# Patient Record
Sex: Female | Born: 1980 | Race: Black or African American | Hispanic: No | Marital: Single | Smoking: Never smoker
Health system: Southern US, Community
[De-identification: ages and names within clinical notes are randomized; demographics above are authoritative.]

## PROBLEM LIST (undated history)

## (undated) DIAGNOSIS — I10 Essential (primary) hypertension: Secondary | ICD-10-CM

## (undated) HISTORY — DX: Essential (primary) hypertension: I10

---

## 2001-12-09 HISTORY — PX: BREAST REDUCTION SURGERY: SHX8

## 2002-04-22 ENCOUNTER — Other Ambulatory Visit: Admission: RE | Admit: 2002-04-22 | Discharge: 2002-04-22 | Payer: Self-pay | Admitting: Obstetrics and Gynecology

## 2002-04-26 ENCOUNTER — Ambulatory Visit (HOSPITAL_BASED_OUTPATIENT_CLINIC_OR_DEPARTMENT_OTHER): Admission: RE | Admit: 2002-04-26 | Discharge: 2002-04-27 | Payer: Self-pay | Admitting: Specialist

## 2004-07-31 ENCOUNTER — Other Ambulatory Visit: Admission: RE | Admit: 2004-07-31 | Discharge: 2004-07-31 | Payer: Self-pay | Admitting: Obstetrics and Gynecology

## 2005-10-28 ENCOUNTER — Other Ambulatory Visit: Admission: RE | Admit: 2005-10-28 | Discharge: 2005-10-28 | Payer: Self-pay | Admitting: Obstetrics & Gynecology

## 2012-02-28 DIAGNOSIS — R519 Headache, unspecified: Secondary | ICD-10-CM | POA: Insufficient documentation

## 2015-07-25 DIAGNOSIS — I10 Essential (primary) hypertension: Secondary | ICD-10-CM | POA: Insufficient documentation

## 2015-09-23 DIAGNOSIS — D500 Iron deficiency anemia secondary to blood loss (chronic): Secondary | ICD-10-CM | POA: Insufficient documentation

## 2018-10-03 ENCOUNTER — Other Ambulatory Visit: Payer: Self-pay

## 2018-10-03 ENCOUNTER — Encounter (HOSPITAL_COMMUNITY): Payer: Self-pay | Admitting: Emergency Medicine

## 2018-10-03 ENCOUNTER — Emergency Department (HOSPITAL_COMMUNITY)
Admission: EM | Admit: 2018-10-03 | Discharge: 2018-10-04 | Disposition: A | Discharge status: Home / Self Care | Payer: BLUE CROSS/BLUE SHIELD | Attending: Emergency Medicine | Admitting: Emergency Medicine

## 2018-10-03 DIAGNOSIS — F19920 Other psychoactive substance use, unspecified with intoxication, uncomplicated: Secondary | ICD-10-CM

## 2018-10-03 DIAGNOSIS — R410 Disorientation, unspecified: Secondary | ICD-10-CM | POA: Insufficient documentation

## 2018-10-03 DIAGNOSIS — R4182 Altered mental status, unspecified: Secondary | ICD-10-CM | POA: Diagnosis present

## 2018-10-03 LAB — COMPREHENSIVE METABOLIC PANEL
ALBUMIN: 4 g/dL (ref 3.5–5.0)
ALT: 21 U/L (ref 0–44)
ANION GAP: 11 (ref 5–15)
AST: 27 U/L (ref 15–41)
Alkaline Phosphatase: 95 U/L (ref 38–126)
BUN: 18 mg/dL (ref 6–20)
CALCIUM: 8.9 mg/dL (ref 8.9–10.3)
CO2: 21 mmol/L — ABNORMAL LOW (ref 22–32)
Chloride: 108 mmol/L (ref 98–111)
Creatinine, Ser: 1.03 mg/dL — ABNORMAL HIGH (ref 0.44–1.00)
GFR calc non Af Amer: >60 mL/min (ref >60)
GLUCOSE: 109 mg/dL — AB (ref 70–99)
POTASSIUM: 3.2 mmol/L — AB (ref 3.5–5.1)
Sodium: 140 mmol/L (ref 135–145)
Total Bilirubin: 0.5 mg/dL (ref 0.3–1.2)
Total Protein: 6.7 g/dL (ref 6.5–8.1)

## 2018-10-03 LAB — I-STAT TROPONIN, ED: TROPONIN I, POC: 0 ng/mL (ref 0.00–0.08)

## 2018-10-03 LAB — I-STAT BETA HCG BLOOD, ED (MC, WL, AP ONLY): I-stat hCG, quantitative: <5 m[IU]/mL (ref <5)

## 2018-10-03 LAB — CBC WITH DIFFERENTIAL/PLATELET
Abs Immature Granulocytes: 0.08 10*3/uL — ABNORMAL HIGH (ref 0.00–0.07)
BASOS ABS: 0.1 10*3/uL (ref 0.0–0.1)
Basophils Relative: 0 %
EOS ABS: 0 10*3/uL (ref 0.0–0.5)
EOS PCT: 0 %
HEMATOCRIT: 36.2 % (ref 36.0–46.0)
Hemoglobin: 11 g/dL — ABNORMAL LOW (ref 12.0–15.0)
Immature Granulocytes: 1 %
LYMPHS ABS: 1.3 10*3/uL (ref 0.7–4.0)
LYMPHS PCT: 7 %
MCH: 24.8 pg — AB (ref 26.0–34.0)
MCHC: 30.4 g/dL (ref 30.0–36.0)
MCV: 81.7 fL (ref 80.0–100.0)
MONOS PCT: 8 %
Monocytes Absolute: 1.3 10*3/uL — ABNORMAL HIGH (ref 0.1–1.0)
NEUTROS ABS: 14.7 10*3/uL — AB (ref 1.7–7.7)
Neutrophils Relative %: 84 %
Platelets: 227 10*3/uL (ref 150–400)
RBC: 4.43 MIL/uL (ref 3.87–5.11)
RDW: 12.9 % (ref 11.5–15.5)
WBC: 17.5 10*3/uL — ABNORMAL HIGH (ref 4.0–10.5)
nRBC: 0 % (ref 0.0–0.2)

## 2018-10-03 LAB — ACETAMINOPHEN LEVEL

## 2018-10-03 LAB — ETHANOL

## 2018-10-03 LAB — SALICYLATE LEVEL: Salicylate Lvl: <7 mg/dL (ref 2.8–30.0)

## 2018-10-03 MED ORDER — LORAZEPAM 2 MG/ML IJ SOLN
2.0000 mg | Freq: Once | INTRAMUSCULAR | Status: AC
  Administered 2018-10-03: 2 mg via INTRAMUSCULAR
  Filled 2018-10-03: qty 1

## 2018-10-03 MED ORDER — SODIUM CHLORIDE 0.9 % IV BOLUS
1000.0000 mL | Freq: Once | INTRAVENOUS | Status: AC
  Administered 2018-10-03: 1000 mL via INTRAVENOUS

## 2018-10-03 MED ORDER — POTASSIUM CHLORIDE 10 MEQ/100ML IV SOLN
10.0000 meq | Freq: Once | INTRAVENOUS | Status: AC
  Administered 2018-10-03: 10 meq via INTRAVENOUS
  Filled 2018-10-03: qty 100

## 2018-10-03 NOTE — ED Notes (Signed)
Attempted to get EKG. Patient keeps throwing her hands around and one of her hands grazed my face. Reported to Nurse.

## 2018-10-03 NOTE — ED Triage Notes (Signed)
Pt presents with friend who states that she has been drinking and ate some chocolate that could have had an illicit drug in it; pt now altered, not acting right; denies pain, sob; ccurently sitting in the floor of A[address]

## 2018-10-03 NOTE — ED Provider Notes (Signed)
[facility] EMERGENCY DEPARTMENT Provider Note   CSN: [id number] Arrival date & time: 10/03/18  1954     History   Chief Complaint Chief Complaint  Patient presents with  . Altered Mental Status  . Alcohol Intoxication    HPI Tara Morris is a 37 y.o. female with no PMHx who presents for evaluation of altered mental status. Patient is alert but only oriented to self. Friend is at bedside and provides the history.   They were at the football game, drinking alcohol and eating chocolate and gummies that may have included marijuana and/or other illicit drugs when the patient began acting strangely. Her activity level waxes and wanes. She goes through episodes of being over excited, wanting to get up and go somewhere and then laying on the ground.   Denies pain, vomiting, chest pain, sob, LOC, syncope. No other medications or medical problems according to the friend.    HPI  History reviewed. No pertinent past medical history.  There are no active problems to display for this patient.   History reviewed. No pertinent surgical history.   OB History   None      Home Medications    Prior to Admission medications   Not on File    Family History History reviewed. No pertinent family history.  Social History Social History   Tobacco Use  . Smoking status: Never Smoker  Substance Use Topics  . Alcohol use: Yes  . Drug use: Not on file    Comment: ukn     Allergies   Patient has no known allergies.   Review of Systems Review of Systems Unable to obtain due to patient's altered mental status  Physical Exam Updated Vital Signs BP 117/81   Pulse (!) 108   Temp 98.7 F (37.1 C) (Oral)   Resp 17   SpO2 97%   Physical Exam Vitals:   10/03/18 2215 10/03/18 2230 10/03/18 2245 10/03/18 2300  BP: 120/83 111/68 113/77 117/81  Pulse: (!) 106 (!) 115 (!) 111 (!) 108  Resp: (!) 23 (!) 25 (!) 26 17  Temp:      TempSrc:      SpO2: 97% 96%  96% 97%   General: Vital signs reviewed.  Patient is well-developed and well-nourished, in no acute distress and cooperative with exam.  Head: Normocephalic and atraumatic. Eyes: PERRL, conjunctivae normal, no scleral icterus.  Cardiovascular: tachycardic, regular rhythm, S1 normal, S2 normal, no murmurs, gallops, or rubs. Pulmonary/Chest: Clear to auscultation bilaterally, no wheezes, rales, or rhonchi. Abdominal: Soft, non-tender, non-distended, BS +, no masses, organomegaly, or guarding present.  Musculoskeletal: No joint deformities, erythema, or stiffness, ROM full and nontender. Neurological: A&O to self only, moving all extremities freely.  Skin: Warm, dry and intact. No rashes or erythema. Psychiatric: anxious mood, alert but minimally responsive to questions, good eye contact   ED Treatments / Results  Labs (all labs ordered are listed, but only abnormal results are displayed) Labs Reviewed  CBC WITH DIFFERENTIAL/PLATELET - Abnormal; Notable for the following components:      Result Value   WBC 17.5 (*)    Hemoglobin 11.0 (*)    MCH 24.8 (*)    Neutro Abs 14.7 (*)    Monocytes Absolute 1.3 (*)    Abs Immature Granulocytes 0.08 (*)    All other components within normal limits  COMPREHENSIVE METABOLIC PANEL - Abnormal; Notable for the following components:   Potassium 3.2 (*)    CO2 21 (*)  Glucose, Bld 109 (*)    Creatinine, Ser 1.03 (*)    All other components within normal limits  ACETAMINOPHEN LEVEL - Abnormal; Notable for the following components:   Acetaminophen (Tylenol), Serum <10 (*)    All other components within normal limits  SALICYLATE LEVEL  ETHANOL  RAPID URINE DRUG SCREEN, HOSP PERFORMED  URINALYSIS, ROUTINE W REFLEX MICROSCOPIC  I-STAT TROPONIN, ED  I-STAT BETA HCG BLOOD, ED ([facility], [facility], [facility] ONLY)    EKG None  Radiology No results found.  Procedures Procedures (including critical care time)  Medications Ordered in ED Medications  sodium  chloride 0.9 % bolus 1,000 mL (has no administration in time range)  potassium chloride 10 mEq in 100 mL IVPB (has no administration in time range)  sodium chloride 0.9 % bolus 1,000 mL (0 mLs Intravenous Stopped 10/03/18 2259)  LORazepam (ATIVAN) injection 2 mg (2 mg Intramuscular Given 10/03/18 2107)     Initial Impression / Assessment and Plan / ED Course  I have reviewed the triage vital signs and the nursing notes.  Pertinent labs & imaging results that were available during my care of the patient were reviewed by me and considered in my medical decision making (see chart for details).     37yo female with no PMHx presents with acute AMS after ingestion alcohol and other unknown substances. Tachycardic but hemodynamically stable. Breathing comfortably. Alert but oriented only to self. Not complaining of pain or chest pain. Uncooperative with exam and lab draws. Will give IM Ativan.   Sedated with Ativan. EKG sinus tach with mildly long QT. Workup negative for alcohol, acetaminophen, salicylate. Low potassium to 3.2, replaced. Leukocytosis with neutrophilia, likely reactive to unknown substance. Urine results pending.   No indication for admission. Will monitor in the ED until she returns to baseline.   Patient signed over to Dr. Clayborne Dana.   Final Clinical Impressions(s) / ED Diagnoses   Final diagnoses:  Disorientation    ED Discharge Orders    None       Ali Lowe, MD 10/03/18 2344    Charlynne Pander, MD 10/06/18 [phone]

## 2018-10-04 LAB — URINALYSIS, ROUTINE W REFLEX MICROSCOPIC
Bilirubin Urine: NEGATIVE
GLUCOSE, UA: NEGATIVE mg/dL
KETONES UR: NEGATIVE mg/dL
LEUKOCYTES UA: NEGATIVE
NITRITE: NEGATIVE
PH: 6 (ref 5.0–8.0)
Protein, ur: 100 mg/dL — AB
RBC / HPF: >50 RBC/hpf — ABNORMAL HIGH (ref 0–5)
Specific Gravity, Urine: 1.018 (ref 1.005–1.030)

## 2018-10-04 LAB — RAPID URINE DRUG SCREEN, HOSP PERFORMED
Amphetamines: NOT DETECTED
BARBITURATES: NOT DETECTED
Benzodiazepines: NOT DETECTED
COCAINE: NOT DETECTED
Opiates: NOT DETECTED
TETRAHYDROCANNABINOL: POSITIVE — AB

## 2018-10-04 NOTE — ED Notes (Addendum)
Dr. Clayborne Dana notified that pts HR in 130-140's after ambulating.

## 2018-10-04 NOTE — ED Provider Notes (Signed)
12:08 AM Assumed care from Dr. Silverio Lay, please see their note for full history, physical and decision making until this point. In brief this is a 37 y.o. year old female who presented to the ED tonight with Altered Mental Status and Alcohol Intoxication     Here with likely intoxication of unknown substance. Improving MS, vitals and waiting urine, clinical sobriety and likely discharge.   After multiple re-evaluations patient does seem to be at her baseline.  She is tolerating p.o.  She is been ambulating around the department without difficulty.  She is awake and alert.  Her family states she is at her baseline.  Patient is stable for discharge at this time.  UDS is positive for marijuana likely edibles with the gummy bears which could produce these effects.  Patient will continue to be monitored by her family at home but low suspicion for any other abnormalities.  Discharge instructions, including strict return precautions for new or worsening symptoms, given. Patient and/or family verbalized understanding and agreement with the plan as described.   Labs, studies and imaging reviewed by myself and considered in medical decision making if ordered. Imaging interpreted by radiology.  Labs Reviewed  CBC WITH DIFFERENTIAL/PLATELET - Abnormal; Notable for the following components:      Result Value   WBC 17.5 (*)    Hemoglobin 11.0 (*)    MCH 24.8 (*)    Neutro Abs 14.7 (*)    Monocytes Absolute 1.3 (*)    Abs Immature Granulocytes 0.08 (*)    All other components within normal limits  COMPREHENSIVE METABOLIC PANEL - Abnormal; Notable for the following components:   Potassium 3.2 (*)    CO2 21 (*)    Glucose, Bld 109 (*)    Creatinine, Ser 1.03 (*)    All other components within normal limits  ACETAMINOPHEN LEVEL - Abnormal; Notable for the following components:   Acetaminophen (Tylenol), Serum <10 (*)    All other components within normal limits  SALICYLATE LEVEL  ETHANOL  RAPID URINE DRUG  SCREEN, HOSP PERFORMED  URINALYSIS, ROUTINE W REFLEX MICROSCOPIC  I-STAT TROPONIN, ED  I-STAT BETA HCG BLOOD, ED ([facility], [facility], [facility] ONLY)    No orders to display    No follow-ups on file.    Exodus Kutzer, Barbara Cower, MD 10/04/18 0430

## 2020-04-02 DIAGNOSIS — Z03818 Encounter for observation for suspected exposure to other biological agents ruled out: Secondary | ICD-10-CM | POA: Diagnosis not present

## 2020-04-02 DIAGNOSIS — Z20828 Contact with and (suspected) exposure to other viral communicable diseases: Secondary | ICD-10-CM | POA: Diagnosis not present

## 2020-04-19 DIAGNOSIS — Z6826 Body mass index (BMI) 26.0-26.9, adult: Secondary | ICD-10-CM | POA: Diagnosis not present

## 2020-04-19 DIAGNOSIS — Z3202 Encounter for pregnancy test, result negative: Secondary | ICD-10-CM | POA: Diagnosis not present

## 2020-04-19 DIAGNOSIS — Z01419 Encounter for gynecological examination (general) (routine) without abnormal findings: Secondary | ICD-10-CM | POA: Diagnosis not present

## 2020-04-20 DIAGNOSIS — Z01419 Encounter for gynecological examination (general) (routine) without abnormal findings: Secondary | ICD-10-CM | POA: Diagnosis not present

## 2020-09-12 ENCOUNTER — Other Ambulatory Visit: Payer: Self-pay

## 2020-09-12 ENCOUNTER — Encounter: Payer: Self-pay | Admitting: Podiatry

## 2020-09-12 ENCOUNTER — Ambulatory Visit (INDEPENDENT_AMBULATORY_CARE_PROVIDER_SITE_OTHER): Payer: BC Managed Care – PPO

## 2020-09-12 ENCOUNTER — Ambulatory Visit (INDEPENDENT_AMBULATORY_CARE_PROVIDER_SITE_OTHER): Payer: BC Managed Care – PPO | Admitting: Podiatry

## 2020-09-12 DIAGNOSIS — S99921A Unspecified injury of right foot, initial encounter: Secondary | ICD-10-CM

## 2020-09-12 DIAGNOSIS — S92501A Displaced unspecified fracture of right lesser toe(s), initial encounter for closed fracture: Secondary | ICD-10-CM

## 2020-09-12 NOTE — Patient Instructions (Signed)
Toe Fracture A toe fracture is a break in one of the toe bones (phalanges). This may happen if you:  Drop a heavy object on your toe.  Stub your toe.  Twist your toe.  Exercise the same way too much. What are the signs or symptoms? The main symptoms are swelling and pain in the toe. You may also have:  Bruising.  Stiffness.  Numbness.  A change in the way the toe looks.  Broken bones that poke through the skin.  Blood under the toenail. How is this treated? Treatments may include:  Taping the broken toe to a toe that is next to it (buddy taping).  Wearing a shoe that has a wide, rigid sole to protect the toe and to limit its movement.  Wearing a cast.  Surgery. This may be needed if the: ? Pieces of broken bone are out of place. ? Bone pokes through the skin.  Physical therapy. Follow these instructions at home: If you have a shoe:  Wear the shoe as told by your doctor. Remove it only as told by your doctor.  Loosen the shoe if your toes tingle, become numb, or turn cold and blue.  Keep the shoe clean and dry. If you have a cast:  Do not put pressure on any part of the cast until it is fully hardened. This may take a few hours.  Do not stick anything inside the cast to scratch your skin.  Check the skin around the cast every day. Tell your doctor about any concerns.  You may put lotion on dry skin around the edges of the cast.  Do not put lotion on the skin under the cast.  Keep the cast clean and dry. Bathing  Do not take baths, swim, or use a hot tub until your doctor says it is okay. Ask your doctor if you can take showers.  If the shoe or cast is not waterproof: ? Do not let it get wet. ? Cover it with a watertight covering when you take a bath or a shower. Activity  Do not use your foot to support your body weight until your doctor says it is okay.  Use crutches as told by your doctor.  Ask your doctor what activities are safe for you  during recovery.  Avoid activities as told by your doctor.  Do exercises as told by your doctor or therapist. Driving  Do not drive or use heavy machinery while taking pain medicine.  Do not drive while wearing a cast on a foot that you use for driving. Managing pain, stiffness, and swelling   Put ice on the injured area if told by your doctor: ? Put ice in a plastic bag. ? Place a towel between your skin and the bag.  If you have a shoe, remove it as told by your doctor.  If you have a cast, place a towel between your cast and the bag. ? Leave the ice on for 20 minutes, 2-3 times per day.  Raise (elevate) the injured area above the level of your heart while you are sitting or lying down. General instructions  If your toe was taped to a toe that is next to it, follow your doctor's instructions for changing the gauze and tape. Change it more often: ? If the gauze and tape get wet. If this happens, dry the space between the toes. ? If the gauze and tape are too tight and they cause your toe to become pale   or to lose feeling (go numb).  If your doctor did not give you a protective shoe, wear sturdy shoes that support your foot. Your shoes should not: ? Pinch your toes. ? Fit tightly against your toes.  Do not use any tobacco products, including cigarettes, chewing tobacco, or e-cigarettes. These can delay bone healing. If you need help quitting, ask your doctor.  Take medicines only as told by your doctor.  Keep all follow-up visits as told by your doctor. This is important. Contact a doctor if:  Your pain medicine is not helping.  You have a fever.  You notice a bad smell coming from your cast. Get help right away if:  You lose feeling (have numbness) in your toe or foot, and it is getting worse.  Your toe or your foot tingles.  Your toe or your foot gets cold or turns blue.  You have redness or swelling in your toe or foot, and it is getting worse.  You have very  bad pain. Summary  A toe fracture is a break in one of the toe bones.  Use ice and raise your foot. This will help lessen pain and swelling.  Use crutches as told by your doctor. This information is not intended to replace advice given to you by your health care provider. Make sure you discuss any questions you have with your health care provider. Document Revised: 01/29/2018 Document Reviewed: 01/06/2018 Elsevier Patient Education  2020 [employer]  

## 2020-09-13 NOTE — Progress Notes (Signed)
Subjective:   Patient ID: Tara Morris, female   DOB: 39 y.o.   MRN: [id number]   HPI 39 year old female presents the office today for concerns of right fifth toe pain.  She states that she tripped and fell injuring her toe about 4 weeks ago and she states it still swells and is tender.  She states that she works on a regular basis any certain movements to aggravate her symptoms.  She said no recent treatment otherwise and she has no other concerns today.   Review of Systems  All other systems reviewed and are negative.  History reviewed. No pertinent past medical history.  History reviewed. No pertinent surgical history.   Current Outpatient Medications:  .  Levonorgestrel-Ethinyl Estradiol (ASHLYNA) 0.15-0.03 &0.01 MG tablet, Take 1 tablet by mouth daily., Disp: , Rfl:  .  nebivolol (BYSTOLIC) 5 MG tablet, Take by mouth., Disp: , Rfl:  .  triamcinolone ointment (KENALOG) 0.1 %, , Disp: , Rfl:  .  LO LOESTRIN FE 1 MG-10 MCG / 10 MCG tablet, Take 1 tablet by mouth daily., Disp: , Rfl:   No Known Allergies       Objective:  Physical Exam  General: AAO x3, NAD  Dermatological: Skin is warm, dry and supple bilateral.  There are no open sores, no preulcerative lesions, no rash or signs of infection present.  Vascular: Dorsalis Pedis artery and Posterior Tibial artery pedal pulses are 2/4 bilateral with immedate capillary fill time.  There is no pain with calf compression, swelling, warmth, erythema.   Neruologic: Grossly intact via light touch bilateral.  Musculoskeletal: There is edema to the right fifth toe with slough in the base of the toe.  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  No other areas of discomfort to the other toes can metatarsals.  Muscular strength 5/5 in all groups tested bilateral.  Gait: Unassisted, Nonantalgic.       Assessment:   Right fifth toe fracture    Plan:  -Treatment options discussed including all alternatives, risks, and  complications -X-rays obtained reviewed.  Transverse fracture of the proximal phalanx base with mild displacement. -At this time I discussed wearing shoes with a stiffer sole.  I would modify some exercises to avoid any pressure to the fifth toe for which we discussed today.  Return in about 6 weeks (around 10/24/2020).  Repeat x-ray next appointment  Vivi Barrack DPM

## 2020-10-17 ENCOUNTER — Ambulatory Visit (INDEPENDENT_AMBULATORY_CARE_PROVIDER_SITE_OTHER): Payer: BC Managed Care – PPO

## 2020-10-17 ENCOUNTER — Ambulatory Visit: Payer: BC Managed Care – PPO | Admitting: Podiatry

## 2020-10-17 ENCOUNTER — Other Ambulatory Visit: Payer: Self-pay

## 2020-10-17 DIAGNOSIS — S92503A Displaced unspecified fracture of unspecified lesser toe(s), initial encounter for closed fracture: Secondary | ICD-10-CM

## 2020-10-17 DIAGNOSIS — M2041 Other hammer toe(s) (acquired), right foot: Secondary | ICD-10-CM

## 2020-10-18 NOTE — Progress Notes (Signed)
Subjective: 39 year old female presents the office today for follow-up evaluation of right fifth toe pain, fracture.  She states that she has modified her activity level.  She states that she does not have significant discomfort at this point and the swelling has resolved  No recent injury since her last saw her.  She states she is using some she may need some pain along her third toes she points on the second third MPJs.  Is been a chronic issue for her. Denies any systemic complaints such as fevers, chills, nausea, vomiting. No acute changes since last appointment, and no other complaints at this time.   Objective: AAO x3, NAD DP/PT pulses palpable bilaterally, CRT less than 3 seconds At this time on the right fifth toe there is no pain to the fifth toe of the toe has no significant edema, erythema.  There is no there is discomfort identified this time but there is contracture of the third digit on the DIPJ and PIPJ.  No significant pain today. No pain with calf compression, swelling, warmth, erythema  Assessment: Right fifth toe fracture with healing; hammertoes  Plan: -All treatment options discussed with the patient including all alternatives, risks, complications.  -X-rays obtained reviewed.  Bone callus formation is present of the fifth toe but fracture line still evident but there is increased consolidation.  No other evidence of acute fracture.  Elongated second third metatarsals. -Regards the fracture site discussed with her wearing stiffer soled shoe but she can gradually start to increase her activity level back to normal activities. -In regards to the other foot pain continue with supportive shoes, inserts.  She discussed surgical intervention.  Discussed these options if needed. -Patient encouraged to call the office with any questions, concerns, change in symptoms.   Vivi Barrack DPM

## 2020-12-04 DIAGNOSIS — N760 Acute vaginitis: Secondary | ICD-10-CM | POA: Diagnosis not present

## 2020-12-04 DIAGNOSIS — Z113 Encounter for screening for infections with a predominantly sexual mode of transmission: Secondary | ICD-10-CM | POA: Diagnosis not present

## 2020-12-04 DIAGNOSIS — N898 Other specified noninflammatory disorders of vagina: Secondary | ICD-10-CM | POA: Diagnosis not present

## 2020-12-22 DIAGNOSIS — B373 Candidiasis of vulva and vagina: Secondary | ICD-10-CM | POA: Diagnosis not present

## 2020-12-22 DIAGNOSIS — N760 Acute vaginitis: Secondary | ICD-10-CM | POA: Diagnosis not present

## 2020-12-22 DIAGNOSIS — A5900 Urogenital trichomoniasis, unspecified: Secondary | ICD-10-CM | POA: Diagnosis not present

## 2020-12-30 ENCOUNTER — Ambulatory Visit (HOSPITAL_COMMUNITY)
Admission: EM | Admit: 2020-12-30 | Discharge: 2020-12-30 | Disposition: A | Discharge status: Home / Self Care | Payer: BC Managed Care – PPO | Attending: Physician Assistant | Admitting: Physician Assistant

## 2020-12-30 ENCOUNTER — Ambulatory Visit (INDEPENDENT_AMBULATORY_CARE_PROVIDER_SITE_OTHER): Payer: BC Managed Care – PPO

## 2020-12-30 ENCOUNTER — Encounter (HOSPITAL_COMMUNITY): Payer: Self-pay | Admitting: Emergency Medicine

## 2020-12-30 ENCOUNTER — Other Ambulatory Visit: Payer: Self-pay

## 2020-12-30 DIAGNOSIS — S63602A Unspecified sprain of left thumb, initial encounter: Secondary | ICD-10-CM

## 2020-12-30 DIAGNOSIS — S6992XA Unspecified injury of left wrist, hand and finger(s), initial encounter: Secondary | ICD-10-CM | POA: Diagnosis not present

## 2020-12-30 DIAGNOSIS — M79645 Pain in left finger(s): Secondary | ICD-10-CM | POA: Diagnosis not present

## 2020-12-30 NOTE — ED Provider Notes (Signed)
[facility]    CSN: [id number] Arrival date & time: 12/30/20  1205      History   Chief Complaint Chief Complaint  Patient presents with  . Finger Injury    Left thumb     HPI Tara Morris is a 40 y.o. female.   The history is provided by the patient. No language interpreter was used.  Hand Pain This is a new problem. The current episode started 3 to 5 hours ago. The problem occurs constantly. The problem has not changed since onset.Nothing aggravates the symptoms. Nothing relieves the symptoms. She has tried nothing for the symptoms. The treatment provided no relief.  Pt hit thumb on a box jumping exercise  History reviewed. No pertinent past medical history.  Patient Active Problem List   Diagnosis Date Noted  . Iron deficiency anemia due to chronic blood loss 09/23/2015  . Essential hypertension 07/25/2015  . Headache 02/28/2012  . Acne 02/28/2012    History reviewed. No pertinent surgical history.  OB History   No obstetric history on file.      Home Medications    Prior to Admission medications   Medication Sig Start Date End Date Taking? Authorizing Provider  LO LOESTRIN FE 1 MG-10 MCG / 10 MCG tablet Take 1 tablet by mouth daily. 07/14/20  Yes [provider]  Levonorgestrel-Ethinyl Estradiol (AMETHIA) 0.15-0.03 &0.01 MG tablet Take 1 tablet by mouth daily. 05/05/15   [provider]  nebivolol (BYSTOLIC) 5 MG tablet Take by mouth. 09/21/15   [provider]  triamcinolone ointment (KENALOG) 0.1 %  01/11/15   [provider]    Family History Family History  Problem Relation Age of Onset  . Hypertension Mother     Social History Social History   Tobacco Use  . Smoking status: Never Smoker  . Smokeless tobacco: Never Used  Substance Use Topics  . Alcohol use: Yes     Allergies   Patient has no known allergies.   Review of Systems Review of Systems  Musculoskeletal: Positive for joint  swelling.  All other systems reviewed and are negative.    Physical Exam Triage Vital Signs ED Triage Vitals  Enc Vitals Group     BP 12/30/20 1234 (!) 154/84     Pulse Rate 12/30/20 1234 67     Resp 12/30/20 1234 18     Temp 12/30/20 1234 98.2 F (36.8 C)     Temp Source 12/30/20 1234 Oral     SpO2 12/30/20 1234 98 %     Weight --      Height --      Head Circumference --      Peak Flow --      Pain Score 12/30/20 1230 5     Pain Loc --      Pain Edu? --      Excl. in GC? --    No data found.  Updated Vital Signs BP (!) 145/99   Pulse 67   Temp 98.2 F (36.8 C) (Oral)   Resp 18   LMP 12/31/2019 (Approximate)   SpO2 98%   Visual Acuity Right Eye Distance:   Left Eye Distance:   Bilateral Distance:    Right Eye Near:   Left Eye Near:    Bilateral Near:     Physical Exam Vitals reviewed.  HENT:     Head: Normocephalic.  Musculoskeletal:        General: Normal range of motion.  Comments: Bruised swollen thumb at [facility]cp.  From  nv and ns intact   Skin:    General: Skin is warm.  Neurological:     General: No focal deficit present.     Mental Status: She is alert.  Psychiatric:        Mood and Affect: Mood normal.      UC Treatments / Results  Labs (all labs ordered are listed, but only abnormal results are displayed) Labs Reviewed - No data to display  EKG   Radiology DG Finger Thumb Left  Result Date: 12/30/2020 CLINICAL DATA:  Trauma this morning.  Pain. EXAM: LEFT THUMB 2+V COMPARISON:  None. FINDINGS: No acute fracture or dislocation. No significant soft tissue swelling. IMPRESSION: No acute osseous abnormality. Electronically Signed   By: Jeronimo Greaves M.D.   On: 12/30/2020 13:29    Procedures Procedures (including critical care time)  Medications Ordered in UC Medications - No data to display  Initial Impression / Assessment and Plan / UC Course  I have reviewed the triage vital signs and the nursing notes.  Pertinent labs &  imaging results that were available during my care of the patient were reviewed by me and considered in my medical decision making (see chart for details).     MDM:  Xray no fracture/  Pt placed in a splint.  Pt advised to recheck in 1 week if any problems.  Final Clinical Impressions(s) / UC Diagnoses   Final diagnoses:  Sprain of left thumb, unspecified site of digit, initial encounter     Discharge Instructions     Return if any problems.    ED Prescriptions    None     PDMP not reviewed this encounter.  An After Visit Summary was printed and given to the patient.    Elson Areas, [place] 12/30/20 1404

## 2020-12-30 NOTE — ED Triage Notes (Signed)
Pt states that she was working out this morning and was doing a specific exercise and when she jumped back down she hit her left thumb on the workout equipment and noticed that her finger went out of place.

## 2020-12-30 NOTE — Discharge Instructions (Signed)
Return if any problems.

## 2021-04-10 DIAGNOSIS — Z1231 Encounter for screening mammogram for malignant neoplasm of breast: Secondary | ICD-10-CM | POA: Diagnosis not present

## 2021-04-11 ENCOUNTER — Other Ambulatory Visit: Payer: Self-pay | Admitting: Obstetrics & Gynecology

## 2021-04-11 DIAGNOSIS — R928 Other abnormal and inconclusive findings on diagnostic imaging of breast: Secondary | ICD-10-CM

## 2021-05-02 DIAGNOSIS — Z01419 Encounter for gynecological examination (general) (routine) without abnormal findings: Secondary | ICD-10-CM | POA: Diagnosis not present

## 2021-05-02 DIAGNOSIS — Z6826 Body mass index (BMI) 26.0-26.9, adult: Secondary | ICD-10-CM | POA: Diagnosis not present

## 2021-05-02 LAB — HM PAP SMEAR: HM Pap smear: NORMAL

## 2021-05-11 ENCOUNTER — Other Ambulatory Visit: Payer: Self-pay

## 2021-05-11 ENCOUNTER — Ambulatory Visit
Admission: RE | Admit: 2021-05-11 | Discharge: 2021-05-11 | Disposition: A | Discharge status: Home / Self Care | Payer: BC Managed Care – PPO | Source: Ambulatory Visit | Attending: Obstetrics & Gynecology | Admitting: Obstetrics & Gynecology

## 2021-05-11 DIAGNOSIS — N6321 Unspecified lump in the left breast, upper outer quadrant: Secondary | ICD-10-CM | POA: Diagnosis not present

## 2021-05-11 DIAGNOSIS — N6002 Solitary cyst of left breast: Secondary | ICD-10-CM | POA: Diagnosis not present

## 2021-05-11 DIAGNOSIS — R928 Other abnormal and inconclusive findings on diagnostic imaging of breast: Secondary | ICD-10-CM

## 2021-07-10 DIAGNOSIS — M25531 Pain in right wrist: Secondary | ICD-10-CM | POA: Diagnosis not present

## 2021-07-10 DIAGNOSIS — S63642A Sprain of metacarpophalangeal joint of left thumb, initial encounter: Secondary | ICD-10-CM | POA: Diagnosis not present

## 2021-07-10 DIAGNOSIS — M79645 Pain in left finger(s): Secondary | ICD-10-CM | POA: Diagnosis not present

## 2021-07-10 DIAGNOSIS — M67431 Ganglion, right wrist: Secondary | ICD-10-CM | POA: Diagnosis not present

## 2021-07-16 DIAGNOSIS — S63115A Dislocation of metacarpophalangeal joint of left thumb, initial encounter: Secondary | ICD-10-CM | POA: Diagnosis not present

## 2021-07-16 DIAGNOSIS — S63642A Sprain of metacarpophalangeal joint of left thumb, initial encounter: Secondary | ICD-10-CM | POA: Diagnosis not present

## 2021-07-16 DIAGNOSIS — M67431 Ganglion, right wrist: Secondary | ICD-10-CM | POA: Diagnosis not present

## 2021-07-18 DIAGNOSIS — L2084 Intrinsic (allergic) eczema: Secondary | ICD-10-CM | POA: Diagnosis not present

## 2021-08-06 DIAGNOSIS — M67431 Ganglion, right wrist: Secondary | ICD-10-CM | POA: Diagnosis not present

## 2021-08-06 DIAGNOSIS — M25642 Stiffness of left hand, not elsewhere classified: Secondary | ICD-10-CM | POA: Diagnosis not present

## 2021-08-06 DIAGNOSIS — S63642A Sprain of metacarpophalangeal joint of left thumb, initial encounter: Secondary | ICD-10-CM | POA: Diagnosis not present

## 2021-08-06 DIAGNOSIS — M25631 Stiffness of right wrist, not elsewhere classified: Secondary | ICD-10-CM | POA: Diagnosis not present

## 2021-08-20 DIAGNOSIS — M25631 Stiffness of right wrist, not elsewhere classified: Secondary | ICD-10-CM | POA: Diagnosis not present

## 2021-08-20 DIAGNOSIS — S63642A Sprain of metacarpophalangeal joint of left thumb, initial encounter: Secondary | ICD-10-CM | POA: Diagnosis not present

## 2021-08-20 DIAGNOSIS — M25642 Stiffness of left hand, not elsewhere classified: Secondary | ICD-10-CM | POA: Diagnosis not present

## 2021-09-03 DIAGNOSIS — M67431 Ganglion, right wrist: Secondary | ICD-10-CM | POA: Diagnosis not present

## 2021-09-03 DIAGNOSIS — S63642A Sprain of metacarpophalangeal joint of left thumb, initial encounter: Secondary | ICD-10-CM | POA: Diagnosis not present

## 2021-09-03 DIAGNOSIS — S63115A Dislocation of metacarpophalangeal joint of left thumb, initial encounter: Secondary | ICD-10-CM | POA: Diagnosis not present

## 2022-01-04 DIAGNOSIS — H02403 Unspecified ptosis of bilateral eyelids: Secondary | ICD-10-CM | POA: Diagnosis not present

## 2022-01-29 ENCOUNTER — Other Ambulatory Visit: Payer: Self-pay

## 2022-01-29 ENCOUNTER — Encounter (HOSPITAL_COMMUNITY): Payer: Self-pay

## 2022-01-29 ENCOUNTER — Ambulatory Visit (HOSPITAL_COMMUNITY)
Admission: EM | Admit: 2022-01-29 | Discharge: 2022-01-29 | Disposition: A | Discharge status: Home / Self Care | Payer: BC Managed Care – PPO | Attending: Student | Admitting: Student

## 2022-01-29 DIAGNOSIS — I10 Essential (primary) hypertension: Secondary | ICD-10-CM | POA: Diagnosis not present

## 2022-01-29 MED ORDER — AMLODIPINE BESYLATE 2.5 MG PO TABS: 2.5000 mg | ORAL_TABLET | Freq: Every day | ORAL | 0 refills | Status: DC

## 2022-01-29 NOTE — ED Provider Notes (Signed)
[facility]    CSN: [id number] Arrival date & time: 01/29/22  8502      History   Chief Complaint No chief complaint on file.   HPI Tara Morris is a 41 y.o. female presenting with hypertension.  Medical history hypertension, this has not been medically treated since 2016.  She is between PCPs, new PCP appointment is 02/11/2022.  Describes few weeks of throbbing frontal headaches rated 2/10.  Denies associated dizziness, vision changes, chest pain, shortness of breath, worst headache of life, weakness.  BP running 120s/80s at home but as high as 150s/100s in the last 3 weeks. Denies changes in routine or lifestyle but did eat doritos last weekend. She does note that she takes OCP contraception with low-dose estrogen.  HPI  History reviewed. No pertinent past medical history.  Patient Active Problem List   Diagnosis Date Noted   Iron deficiency anemia due to chronic blood loss 09/23/2015   Essential hypertension 07/25/2015   Headache 02/28/2012   Acne 02/28/2012    History reviewed. No pertinent surgical history.  OB History   No obstetric history on file.      Home Medications    Prior to Admission medications   Medication Sig Start Date End Date Taking? Authorizing Provider  amLODipine (NORVASC) 2.5 MG tablet Take 1 tablet (2.5 mg total) by mouth daily. 01/29/22  Yes Rhys Martini, PA-C  Levonorgestrel-Ethinyl Estradiol (AMETHIA) 0.15-0.03 &0.01 MG tablet Take 1 tablet by mouth daily. 05/05/15   [provider]  LO LOESTRIN FE 1 MG-10 MCG / 10 MCG tablet Take 1 tablet by mouth daily. 07/14/20   [provider]  nebivolol (BYSTOLIC) 5 MG tablet Take by mouth. 09/21/15   [provider]  triamcinolone ointment (KENALOG) 0.1 %  01/11/15   [provider]    Family History Family History  Problem Relation Age of Onset   Hypertension Mother     Social History Social History   Tobacco Use   Smoking status: Never    Smokeless tobacco: Never  Vaping Use   Vaping Use: Never used  Substance Use Topics   Alcohol use: Yes   Drug use: Never    Comment: ukn     Allergies   Patient has no known allergies.   Review of Systems Review of Systems  Neurological:  Positive for headaches.  All other systems reviewed and are negative.   Physical Exam Triage Vital Signs ED Triage Vitals  Enc Vitals Group     BP 01/29/22 1123 (!) 159/108     Pulse Rate 01/29/22 1123 76     Resp 01/29/22 1123 16     Temp 01/29/22 1123 98.3 F (36.8 C)     Temp Source 01/29/22 1123 Oral     SpO2 01/29/22 1123 96 %     Weight --      Height --      Head Circumference --      Peak Flow --      Pain Score 01/29/22 1124 4     Pain Loc --      Pain Edu? --      Excl. in GC? --    No data found.  Updated Vital Signs BP (!) 159/108 (BP Location: Left Arm)    Pulse 76    Temp 98.3 F (36.8 C) (Oral)    Resp 16    SpO2 96%   Visual Acuity Right Eye Distance:   Left Eye Distance:  Bilateral Distance:    Right Eye Near:   Left Eye Near:    Bilateral Near:     Physical Exam Vitals reviewed.  Constitutional:      Appearance: Normal appearance. She is not diaphoretic.  HENT:     Head: Normocephalic and atraumatic.     Mouth/Throat:     Mouth: Mucous membranes are moist.  Eyes:     Extraocular Movements: Extraocular movements intact.     Pupils: Pupils are equal, round, and reactive to light.  Cardiovascular:     Rate and Rhythm: Normal rate and regular rhythm.     Pulses:          Radial pulses are 2+ on the right side and 2+ on the left side.     Heart sounds: Normal heart sounds.  Pulmonary:     Effort: Pulmonary effort is normal.     Breath sounds: Normal breath sounds.  Abdominal:     Palpations: Abdomen is soft.     Tenderness: There is no abdominal tenderness. There is no guarding or rebound.  Musculoskeletal:     Right lower leg: No edema.     Left lower leg: No edema.  Skin:    General:  Skin is warm.     Capillary Refill: Capillary refill takes less than 2 seconds.  Neurological:     General: No focal deficit present.     Mental Status: She is alert and oriented to person, place, and time.     Comments: CN 2-12 grossly intact, PERRLA, EOMI  Psychiatric:        Mood and Affect: Mood normal.        Behavior: Behavior normal.        Thought Content: Thought content normal.        Judgment: Judgment normal.     UC Treatments / Results  Labs (all labs ordered are listed, but only abnormal results are displayed) Labs Reviewed - No data to display  EKG   Radiology No results found.  Procedures Procedures (including critical care time)  Medications Ordered in UC Medications - No data to display  Initial Impression / Assessment and Plan / UC Course  I have reviewed the triage vital signs and the nursing notes.  Pertinent labs & imaging results that were available during my care of the patient were reviewed by me and considered in my medical decision making (see chart for details).     This patient is a very pleasant 41 y.o. year old female presenting with essential hypertension.   BP running 150s/100s. Will start on low-dose amlodipine. She does take OCP contraception with low-dose estrogen; could discuss stopping/ changing this at PCP appt. She establishes care with new PCP on 02/11/22  ED return precautions discussed. Patient verbalizes understanding and agreement.    Final Clinical Impressions(s) / UC Diagnoses   Final diagnoses:  Essential hypertension     Discharge Instructions      -Amlodipine one pill daily. Take at same time every day.  -Follow-up with PCP as scheduled 02/11/22 -Please check your blood pressure at home or at the pharmacy. If this continues to be >140/90, follow-up with your primary care provider for further blood pressure management/ medication titration. If you develop chest pain, shortness of breath, vision changes, the worst  headache of your life- head straight to the ED or call 911.    ED Prescriptions     Medication Sig Dispense Auth. Provider   amLODipine (NORVASC) 2.5 MG  tablet Take 1 tablet (2.5 mg total) by mouth daily. 30 tablet Rhys Martini, PA-C      PDMP not reviewed this encounter.   Rhys Martini, PA-C 01/29/22 1221

## 2022-01-29 NOTE — Discharge Instructions (Addendum)
-  Amlodipine one pill daily. Take at same time every day.  -Follow-up with PCP as scheduled 02/11/22 -Please check your blood pressure at home or at the pharmacy. If this continues to be >140/90, follow-up with your primary care provider for further blood pressure management/ medication titration. If you develop chest pain, shortness of breath, vision changes, the worst headache of your life- head straight to the ED or call 911.

## 2022-01-29 NOTE — ED Triage Notes (Signed)
Pt presents with headache x 2-3 days and high blood pressure. Mother with history of hypertension.

## 2022-02-08 ENCOUNTER — Other Ambulatory Visit: Payer: Self-pay

## 2022-02-11 ENCOUNTER — Encounter: Payer: Self-pay | Admitting: Nurse Practitioner

## 2022-02-11 ENCOUNTER — Other Ambulatory Visit: Payer: Self-pay

## 2022-02-11 ENCOUNTER — Ambulatory Visit: Payer: BC Managed Care – PPO | Admitting: Nurse Practitioner

## 2022-02-11 VITALS — BP 138/100 | HR 81 | Temp 98.6°F | Ht 65.0 in | Wt 167.4 lb

## 2022-02-11 DIAGNOSIS — E663 Overweight: Secondary | ICD-10-CM

## 2022-02-11 DIAGNOSIS — Z136 Encounter for screening for cardiovascular disorders: Secondary | ICD-10-CM | POA: Diagnosis not present

## 2022-02-11 DIAGNOSIS — Z13220 Encounter for screening for lipoid disorders: Secondary | ICD-10-CM

## 2022-02-11 DIAGNOSIS — I10 Essential (primary) hypertension: Secondary | ICD-10-CM | POA: Diagnosis not present

## 2022-02-11 LAB — CBC WITH DIFFERENTIAL/PLATELET
Basophils Absolute: 0 10*3/uL (ref 0.0–0.1)
Basophils Relative: 0.7 % (ref 0.0–3.0)
Eosinophils Absolute: 0.1 10*3/uL (ref 0.0–0.7)
Eosinophils Relative: 1.8 % (ref 0.0–5.0)
HCT: 37.5 % (ref 36.0–46.0)
Hemoglobin: 12.1 g/dL (ref 12.0–15.0)
Lymphocytes Relative: 27.2 % (ref 12.0–46.0)
Lymphs Abs: 1.9 10*3/uL (ref 0.7–4.0)
MCHC: 32.2 g/dL (ref 30.0–36.0)
MCV: 76.9 fl — ABNORMAL LOW (ref 78.0–100.0)
Monocytes Absolute: 0.3 10*3/uL (ref 0.1–1.0)
Monocytes Relative: 4.9 % (ref 3.0–12.0)
Neutro Abs: 4.5 10*3/uL (ref 1.4–7.7)
Neutrophils Relative %: 65.4 % (ref 43.0–77.0)
Platelets: 238 10*3/uL (ref 150.0–400.0)
RBC: 4.88 Mil/uL (ref 3.87–5.11)
RDW: 12.4 % (ref 11.5–15.5)
WBC: 6.8 10*3/uL (ref 4.0–10.5)

## 2022-02-11 LAB — COMPREHENSIVE METABOLIC PANEL
ALT: 12 U/L (ref 0–35)
AST: 15 U/L (ref 0–37)
Albumin: 4.3 g/dL (ref 3.5–5.2)
Alkaline Phosphatase: 62 U/L (ref 39–117)
BUN: 13 mg/dL (ref 6–23)
CO2: 27 mEq/L (ref 19–32)
Calcium: 9.2 mg/dL (ref 8.4–10.5)
Chloride: 103 mEq/L (ref 96–112)
Creatinine, Ser: 0.89 mg/dL (ref 0.40–1.20)
GFR: 80.51 mL/min (ref >60.00)
Glucose, Bld: 81 mg/dL (ref 70–99)
Potassium: 4.1 mEq/L (ref 3.5–5.1)
Sodium: 137 mEq/L (ref 135–145)
Total Bilirubin: 0.6 mg/dL (ref 0.2–1.2)
Total Protein: 7 g/dL (ref 6.0–8.3)

## 2022-02-11 LAB — HM HEPATITIS C SCREENING LAB: HM Hepatitis Screen: NEGATIVE

## 2022-02-11 LAB — HEMOGLOBIN A1C: Hgb A1c MFr Bld: 4.7 % (ref 4.6–6.5)

## 2022-02-11 LAB — LIPID PANEL
Cholesterol: 137 mg/dL (ref 0–200)
HDL: 54.4 mg/dL (ref >39.00)
LDL Cholesterol: 68 mg/dL (ref 0–99)
NonHDL: 82.36
Total CHOL/HDL Ratio: 3
Triglycerides: 72 mg/dL (ref 0.0–149.0)
VLDL: 14.4 mg/dL (ref 0.0–40.0)

## 2022-02-11 LAB — TSH: TSH: 2.37 u[IU]/mL (ref 0.35–5.50)

## 2022-02-11 LAB — HM HIV SCREENING LAB: HM HIV Screening: NEGATIVE

## 2022-02-11 MED ORDER — AMLODIPINE BESYLATE 5 MG PO TABS: 5.0000 mg | ORAL_TABLET | Freq: Every day | ORAL | 0 refills | Status: DC

## 2022-02-11 NOTE — Patient Instructions (Signed)
It was great to see you! ? ?We are checking your labs today and will send the results to mychart/call you ? ?Increase your amlodipine to 5 mg daily. You can take 2 of your current tablets daily, then go back to 1 tablet daily with your new prescription. Try to limit the amount of salt you are eating to 2,000mg  daily. ? ?Let's follow-up in 4 weeks, sooner if you have concerns. ? ?If a referral was placed today, you will be contacted for an appointment. Please note that routine referrals can sometimes take up to 3-4 weeks to process. Please call our office if you haven't heard anything after this time frame. ? ?Take care, ? ?Rodman Pickle, NP ? ?

## 2022-02-11 NOTE — Progress Notes (Signed)
? ?New Patient Office Visit ? ?Subjective:  ?Patient ID: Tara Morris, female    DOB: Jul 22, 1981  Age: 41 y.o. MRN: [id number] ? ?CC:  ?Chief Complaint  ?Patient presents with  ? Establish Care  ?  Np. Est care. Pt c/o elevated BP. Precrbed low dose of amlodipine 2.5mg    ? ? ?HPI ?Tara Morris presents for new patient visit to establish care.  Introduced to Publishing rights manager role and practice setting.  All questions answered.  Discussed provider/patient relationship and expectations. ? ?She has been experiencing headaches for the past few weeks and went to urgent care because her blood pressure was elevated. She took ibuprofen which did not help the pain. She describes it as not a "full" headache, but feels like one is starting or lingering. She was prescribed amlodipine 2.5mg  which she has been taking daily for the last 2 weeks.  She denies any side effects.  She has not been checking her blood pressure at home since starting this medication. ? ?HYPERTENSION ? ?Hypertension status: uncontrolled  ?Satisfied with current treatment? yes ?Duration of hypertension:  last month ?BP monitoring frequency:  a few times a month ?BP range: 146/96 ?BP medication side effects:  no ?Medication compliance: excellent compliance ?Previous BP meds: amlodipine ?Aspirin: no ?Recurrent headaches: yes ?Visual changes: no ?Palpitations: no ?Dyspnea: no ?Chest pain: no ?Lower extremity edema: no ?Dizzy/lightheaded: no ? ? ?Past Medical History:  ?Diagnosis Date  ? Hypertension   ? ? ?Past Surgical History:  ?Procedure Laterality Date  ? BREAST REDUCTION SURGERY  2003  ? ? ?Family History  ?Problem Relation Age of Onset  ? Hypertension Mother   ? Hyperlipidemia Maternal Grandmother   ? Hypertension Maternal Grandmother   ? Cancer Paternal Grandfather   ?     prostate  ? ? ?Social History  ? ?Socioeconomic History  ? Marital status: Single  ?  Spouse name: Not on file  ? Number of children: Not on file  ? Years of education: Not on  file  ? Highest education level: Not on file  ?Occupational History  ? Not on file  ?Tobacco Use  ? Smoking status: Never  ? Smokeless tobacco: Never  ?Vaping Use  ? Vaping Use: Never used  ?Substance and Sexual Activity  ? Alcohol use: Yes  ?  Comment: socially  ? Drug use: Never  ? Sexual activity: Not on file  ?Other Topics Concern  ? Not on file  ?Social History Narrative  ? Not on file  ? ?Social Determinants of Health  ? ?Financial Resource Strain: Not on file  ?Food Insecurity: Not on file  ?Transportation Needs: Not on file  ?Physical Activity: Not on file  ?Stress: Not on file  ?Social Connections: Not on file  ?Intimate Partner Violence: Not on file  ? ? ?ROS ?Review of Systems  ?Constitutional: Negative.   ?HENT: Negative.    ?Eyes: Negative.   ?Respiratory: Negative.    ?Cardiovascular: Negative.   ?Gastrointestinal: Negative.   ?Endocrine: Negative.   ?Genitourinary: Negative.   ?Musculoskeletal: Negative.   ?Skin: Negative.   ?Neurological:  Positive for headaches. Negative for dizziness.  ?Psychiatric/Behavioral: Negative.    ? ?Objective:  ? ?Today's Vitals: BP (!) 138/100 (BP Location: Right Arm, Cuff Size: Normal)   Pulse 81   Temp 98.6 ?F (37 ?C) (Oral)   Ht 5\' 5"  (1.651 m)   Wt 167 lb 6.4 oz (75.9 kg)   SpO2 100%   BMI 27.86 kg/m?  ? ?Physical  Exam ?Vitals and nursing note reviewed.  ?Constitutional:   ?   General: She is not in acute distress. ?   Appearance: Normal appearance.  ?HENT:  ?   Head: Normocephalic and atraumatic.  ?   Right Ear: Tympanic membrane, ear canal and external ear normal.  ?   Left Ear: Tympanic membrane, ear canal and external ear normal.  ?   Nose: Nose normal.  ?   Mouth/Throat:  ?   Mouth: Mucous membranes are moist.  ?   Pharynx: Oropharynx is clear.  ?Eyes:  ?   Conjunctiva/sclera: Conjunctivae normal.  ?   Pupils: Pupils are equal, round, and reactive to light.  ?Cardiovascular:  ?   Rate and Rhythm: Normal rate and regular rhythm.  ?   Pulses: Normal pulses.   ?   Heart sounds: Normal heart sounds.  ?Pulmonary:  ?   Effort: Pulmonary effort is normal.  ?   Breath sounds: Normal breath sounds.  ?Abdominal:  ?   General: Bowel sounds are normal.  ?   Palpations: Abdomen is soft.  ?   Tenderness: There is no abdominal tenderness.  ?Musculoskeletal:     ?   General: Normal range of motion.  ?   Cervical back: Normal range of motion.  ?   Right lower leg: No edema.  ?   Left lower leg: No edema.  ?Skin: ?   General: Skin is warm and dry.  ?Neurological:  ?   General: No focal deficit present.  ?   Mental Status: She is alert and oriented to person, place, and time.  ?   Motor: No weakness.  ?   Coordination: Coordination normal.  ?   Gait: Gait normal.  ?Psychiatric:     ?   Mood and Affect: Mood normal.     ?   Behavior: Behavior normal.     ?   Thought Content: Thought content normal.     ?   Judgment: Judgment normal.  ? ? ?Assessment & Plan:  ? ?Problem List Items Addressed This Visit   ? ?  ? Cardiovascular and Mediastinum  ? Primary hypertension - Primary  ?  Acute x4 weeks.  Blood pressure today 138/100.  Discussed limiting salt in her diet to 2000 mg daily along with exercise.  We will increase amlodipine to 5 mg daily.  Encouraged her to check her blood pressure daily at home and write it down.  Check CMP, CBC, TSH today.  Of note, she has been on her oral contraceptive pill for numerous years and this is not a new medication.  Follow-up in 4 weeks. ?  ?  ? Relevant Medications  ? amLODipine (NORVASC) 5 MG tablet  ? Other Relevant Orders  ? Comprehensive metabolic panel  ? TSH  ? Hemoglobin A1C  ? CBC with Differential/Platelet  ? ?Other Visit Diagnoses   ? ? Encounter for lipid screening for cardiovascular disease      ? Check lipid panel today, treat based on results.  ? Relevant Orders  ? Lipid panel  ? Overweight      ? Discussed diet and exercise.  Check A1c per patient request.  ? Relevant Orders  ? Hemoglobin A1C  ? ?  ? ? ?Outpatient Encounter Medications as  of 02/11/2022  ?Medication Sig  ? amLODipine (NORVASC) 5 MG tablet Take 1 tablet (5 mg total) by mouth daily.  ? LO LOESTRIN FE 1 MG-10 MCG / 10 MCG tablet Take 1 tablet  by mouth daily.  ? [DISCONTINUED] amLODipine (NORVASC) 2.5 MG tablet Take 1 tablet (2.5 mg total) by mouth daily.  ? Multiple Vitamin (MULTIVITAMIN ADULT PO)   ? [DISCONTINUED] Levonorgestrel-Ethinyl Estradiol (AMETHIA) 0.15-0.03 &0.01 MG tablet Take 1 tablet by mouth daily.  ? [DISCONTINUED] nebivolol (BYSTOLIC) 5 MG tablet Take by mouth. (Patient not taking: Reported on 02/11/2022)  ? [DISCONTINUED] triamcinolone ointment (KENALOG) 0.1 %  (Patient not taking: Reported on 02/11/2022)  ? ?No facility-administered encounter medications on file as of 02/11/2022.  ? ? ?Follow-up: Return in about 4 weeks (around 03/11/2022) for HTN.  ? ?Gerre Scull, NP ? ?

## 2022-02-11 NOTE — Telephone Encounter (Signed)
Noted. We discussed during her visit ?

## 2022-02-11 NOTE — Assessment & Plan Note (Addendum)
Acute x4 weeks.  Blood pressure today 138/100.  Discussed limiting salt in her diet to 2000 mg daily along with exercise.  We will increase amlodipine to 5 mg daily.  Encouraged her to check her blood pressure daily at home and write it down.  Check CMP, CBC, TSH today.  Of note, she has been on her oral contraceptive pill for numerous years and this is not a new medication.  Follow-up in 4 weeks. ?

## 2022-03-07 NOTE — Progress Notes (Signed)
? ?Established Patient Office Visit ? ?Subjective:  ?Patient ID: Tara Morris, female    DOB: 1981-10-29  Age: 41 y.o. MRN: [id number] ? ?CC:  ?Chief Complaint  ?Patient presents with  ? Hypertension  ?  4 wk f/u HTN.   ? ? ?HPI ?Tara Morris presents for follow-up on hypertension.  Her amlodipine was increased to 5 mg daily last visit.  She has not been checking her blood pressure every day at home, however last week it was 124/84.  She has been looking at nutrition labels and trying to limit salt in her diet.  She has not been having headaches as frequently.  She denies chest pain, shortness of breath, and leg swelling. ? ?Past Medical History:  ?Diagnosis Date  ? Hypertension   ? ? ?Past Surgical History:  ?Procedure Laterality Date  ? BREAST REDUCTION SURGERY  2003  ? ? ?Family History  ?Problem Relation Age of Onset  ? Hypertension Mother   ? Hyperlipidemia Maternal Grandmother   ? Hypertension Maternal Grandmother   ? Cancer Paternal Grandfather   ?     prostate  ? ? ?Social History  ? ?Socioeconomic History  ? Marital status: Single  ?  Spouse name: Not on file  ? Number of children: Not on file  ? Years of education: Not on file  ? Highest education level: Not on file  ?Occupational History  ? Not on file  ?Tobacco Use  ? Smoking status: Never  ? Smokeless tobacco: Never  ?Vaping Use  ? Vaping Use: Never used  ?Substance and Sexual Activity  ? Alcohol use: Yes  ?  Comment: socially  ? Drug use: Never  ? Sexual activity: Not on file  ?Other Topics Concern  ? Not on file  ?Social History Narrative  ? Not on file  ? ?Social Determinants of Health  ? ?Financial Resource Strain: Not on file  ?Food Insecurity: Not on file  ?Transportation Needs: Not on file  ?Physical Activity: Not on file  ?Stress: Not on file  ?Social Connections: Not on file  ?Intimate Partner Violence: Not on file  ? ? ?Outpatient Medications Prior to Visit  ?Medication Sig Dispense Refill  ? amLODipine (NORVASC) 5 MG tablet Take 1 tablet  (5 mg total) by mouth daily. 90 tablet 0  ? LO LOESTRIN FE 1 MG-10 MCG / 10 MCG tablet Take 1 tablet by mouth daily.    ? Multiple Vitamin (MULTIVITAMIN ADULT PO)     ? ?No facility-administered medications prior to visit.  ? ? ?No Known Allergies ? ?ROS ?Review of Systems ?See pertinent positives and negatives per HPI. ?  ?Objective:  ?  ?Physical Exam ?Vitals and nursing note reviewed.  ?Constitutional:   ?   General: She is not in acute distress. ?   Appearance: Normal appearance.  ?HENT:  ?   Head: Normocephalic and atraumatic.  ?Eyes:  ?   Conjunctiva/sclera: Conjunctivae normal.  ?Cardiovascular:  ?   Rate and Rhythm: Normal rate and regular rhythm.  ?   Pulses: Normal pulses.  ?   Heart sounds: Normal heart sounds.  ?Pulmonary:  ?   Effort: Pulmonary effort is normal.  ?   Breath sounds: Normal breath sounds.  ?Musculoskeletal:  ?   Cervical back: Normal range of motion.  ?Skin: ?   General: Skin is warm and dry.  ?Neurological:  ?   General: No focal deficit present.  ?   Mental Status: She is alert and oriented to  person, place, and time.  ?Psychiatric:     ?   Mood and Affect: Mood normal.     ?   Behavior: Behavior normal.     ?   Thought Content: Thought content normal.     ?   Judgment: Judgment normal.  ? ? ?BP 129/88 (BP Location: Left Arm, Patient Position: Sitting)   Pulse 84   Temp (!) 96.7 ?F (35.9 ?C) (Temporal)   Wt 170 lb (77.1 kg)   SpO2 99%   BMI 28.29 kg/m?  ?Wt Readings from Last 3 Encounters:  ?03/11/22 170 lb (77.1 kg)  ?02/11/22 167 lb 6.4 oz (75.9 kg)  ? ? ? ?Health Maintenance Due  ?Topic Date Due  ? TETANUS/TDAP  05/04/2020  ? ? ?There are no preventive care reminders to display for this patient. ? ?Lab Results  ?Component Value Date  ? TSH 2.37 02/11/2022  ? ?Lab Results  ?Component Value Date  ? WBC 6.8 02/11/2022  ? HGB 12.1 02/11/2022  ? HCT 37.5 02/11/2022  ? MCV 76.9 (L) 02/11/2022  ? PLT 238.0 02/11/2022  ? ?Lab Results  ?Component Value Date  ? NA 137 02/11/2022  ? K 4.1  02/11/2022  ? CO2 27 02/11/2022  ? GLUCOSE 81 02/11/2022  ? BUN 13 02/11/2022  ? CREATININE 0.89 02/11/2022  ? BILITOT 0.6 02/11/2022  ? ALKPHOS 62 02/11/2022  ? AST 15 02/11/2022  ? ALT 12 02/11/2022  ? PROT 7.0 02/11/2022  ? ALBUMIN 4.3 02/11/2022  ? CALCIUM 9.2 02/11/2022  ? ANIONGAP 11 10/03/2018  ? GFR 80.51 02/11/2022  ? ?Lab Results  ?Component Value Date  ? CHOL 137 02/11/2022  ? ?Lab Results  ?Component Value Date  ? HDL 54.40 02/11/2022  ? ?Lab Results  ?Component Value Date  ? LDLCALC 68 02/11/2022  ? ?Lab Results  ?Component Value Date  ? TRIG 72.0 02/11/2022  ? ?Lab Results  ?Component Value Date  ? CHOLHDL 3 02/11/2022  ? ?Lab Results  ?Component Value Date  ? HGBA1C 4.7 02/11/2022  ? ? ?  ?Assessment & Plan:  ? ?Problem List Items Addressed This Visit   ?None ? ? ?No orders of the defined types were placed in this encounter. ? ? ?Follow-up: No follow-ups on file.  ? ? ?Gerre Scull, NP ?

## 2022-03-11 ENCOUNTER — Ambulatory Visit: Payer: BC Managed Care – PPO | Admitting: Nurse Practitioner

## 2022-03-11 ENCOUNTER — Encounter: Payer: Self-pay | Admitting: Nurse Practitioner

## 2022-03-11 VITALS — BP 129/88 | HR 84 | Temp 96.7°F | Wt 170.0 lb

## 2022-03-11 DIAGNOSIS — I10 Essential (primary) hypertension: Secondary | ICD-10-CM | POA: Diagnosis not present

## 2022-03-11 NOTE — Assessment & Plan Note (Signed)
Chronic, stable.  Blood pressure today 129/88.  We will continue amlodipine 5 mg daily.  Continue limiting salt in her diet.  No refills needed at this time.  Follow-up in 6 months. ?

## 2022-03-11 NOTE — Patient Instructions (Addendum)
It was great to see you! ? ?Keep taking the amlodipine 1 tablet daily.  Keep checking your blood pressure at home as well. ? ?Let's follow-up in 3 months, sooner if you have concerns. ? ?If a referral was placed today, you will be contacted for an appointment. Please note that routine referrals can sometimes take up to 3-4 weeks to process. Please call our office if you haven't heard anything after this time frame. ? ?Take care, ? ?Rodman Pickle, NP ? ?

## 2022-05-23 ENCOUNTER — Other Ambulatory Visit: Payer: Self-pay

## 2022-05-23 MED ORDER — AMLODIPINE BESYLATE 5 MG PO TABS: 5.0000 mg | ORAL_TABLET | Freq: Every day | ORAL | 0 refills | Status: DC

## 2022-06-10 ENCOUNTER — Ambulatory Visit (INDEPENDENT_AMBULATORY_CARE_PROVIDER_SITE_OTHER): Payer: BC Managed Care – PPO | Admitting: Nurse Practitioner

## 2022-06-10 ENCOUNTER — Encounter: Payer: Self-pay | Admitting: Nurse Practitioner

## 2022-06-10 VITALS — BP 126/84 | HR 72 | Temp 97.5°F | Wt 176.2 lb

## 2022-06-10 DIAGNOSIS — Z0000 Encounter for general adult medical examination without abnormal findings: Secondary | ICD-10-CM | POA: Diagnosis not present

## 2022-06-10 DIAGNOSIS — Z23 Encounter for immunization: Secondary | ICD-10-CM

## 2022-06-10 DIAGNOSIS — I10 Essential (primary) hypertension: Secondary | ICD-10-CM

## 2022-06-10 NOTE — Patient Instructions (Signed)
It was great to see you!  Keep taking your amlodipine 5mg  daily.   Let's follow-up in 6 months, sooner if you have concerns.  If a referral was placed today, you will be contacted for an appointment. Please note that routine referrals can sometimes take up to 3-4 weeks to process. Please call our office if you haven't heard anything after this time frame.  Take care,  , NP

## 2022-06-10 NOTE — Progress Notes (Signed)
BP 126/84   Pulse 72   Temp (!) 97.5 F (36.4 C) (Temporal)   Wt 176 lb 3.2 oz (79.9 kg)   SpO2 97%   BMI 29.32 kg/m    Subjective:    Patient ID: Tara Morris, female    DOB: 07-Jul-1981, 41 y.o.   MRN: [id number]  CC: Chief Complaint  Patient presents with   Annual Exam    CPE. Pt is fasting   HPI: Tara Morris is a 41 y.o. female presenting on 06/10/2022 for comprehensive medical examination. Current medical complaints include:none  She currently lives with: alone Menopausal Symptoms: no  Depression Screen done today and results listed below:     06/10/2022    8:30 AM 02/11/2022    8:47 AM  Depression screen PHQ 2/9  Decreased Interest 0 0  Down, Depressed, Hopeless 0 0  PHQ - 2 Score 0 0  Altered sleeping  0  Tired, decreased energy  0  Change in appetite  0  Feeling bad or failure about yourself   0  Trouble concentrating  0  Moving slowly or fidgety/restless  0  Suicidal thoughts  0  PHQ-9 Score  0    The patient does not have a history of falls. I did not complete a risk assessment for falls. A plan of care for falls was not documented.   Past Medical History:  Past Medical History:  Diagnosis Date   Hypertension     Surgical History:  Past Surgical History:  Procedure Laterality Date   BREAST REDUCTION SURGERY  2003    Medications:  Current Outpatient Medications on File Prior to Visit  Medication Sig   amLODipine (NORVASC) 5 MG tablet Take 1 tablet (5 mg total) by mouth daily.   LO LOESTRIN FE 1 MG-10 MCG / 10 MCG tablet Take 1 tablet by mouth daily.   Multiple Vitamin (MULTIVITAMIN ADULT PO)    No current facility-administered medications on file prior to visit.    Allergies:  No Known Allergies  Social History:  Social History   Socioeconomic History   Marital status: Single    Spouse name: Not on file   Number of children: Not on file   Years of education: Not on file   Highest education level: Not on file  Occupational  History   Not on file  Tobacco Use   Smoking status: Never   Smokeless tobacco: Never  Vaping Use   Vaping Use: Never used  Substance and Sexual Activity   Alcohol use: Yes    Comment: socially   Drug use: Never   Sexual activity: Not on file  Other Topics Concern   Not on file  Social History Narrative   Not on file   Social Determinants of Health   Financial Resource Strain: Not on file  Food Insecurity: Not on file  Transportation Needs: Not on file  Physical Activity: Not on file  Stress: Not on file  Social Connections: Not on file  Intimate Partner Violence: Not on file   Social History   Tobacco Use  Smoking Status Never  Smokeless Tobacco Never   Social History   Substance and Sexual Activity  Alcohol Use Yes   Comment: socially    Family History:  Family History  Problem Relation Age of Onset   Hypertension Mother    Hyperlipidemia Maternal Grandmother    Hypertension Maternal Grandmother    Cancer Paternal Grandfather        prostate  Past medical history, surgical history, medications, allergies, family history and social history reviewed with patient today and changes made to appropriate areas of the chart.   Review of Systems  Constitutional: Negative.   HENT: Negative.    Eyes: Negative.   Respiratory: Negative.    Cardiovascular: Negative.   Gastrointestinal: Negative.   Genitourinary: Negative.   Musculoskeletal: Negative.   Skin: Negative.   Neurological: Negative.   Psychiatric/Behavioral: Negative.     All other ROS negative except what is listed above and in the HPI.      Objective:    BP 126/84   Pulse 72   Temp (!) 97.5 F (36.4 C) (Temporal)   Wt 176 lb 3.2 oz (79.9 kg)   SpO2 97%   BMI 29.32 kg/m   Wt Readings from Last 3 Encounters:  06/10/22 176 lb 3.2 oz (79.9 kg)  03/11/22 170 lb (77.1 kg)  02/11/22 167 lb 6.4 oz (75.9 kg)    Physical Exam Vitals and nursing note reviewed.  Constitutional:       General: She is not in acute distress.    Appearance: Normal appearance.  HENT:     Head: Normocephalic and atraumatic.     Right Ear: Tympanic membrane, ear canal and external ear normal.     Left Ear: Tympanic membrane, ear canal and external ear normal.     Nose: Nose normal.     Mouth/Throat:     Mouth: Mucous membranes are moist.     Pharynx: Oropharynx is clear.  Eyes:     Conjunctiva/sclera: Conjunctivae normal.  Cardiovascular:     Rate and Rhythm: Normal rate and regular rhythm.     Pulses: Normal pulses.     Heart sounds: Normal heart sounds.  Pulmonary:     Effort: Pulmonary effort is normal.     Breath sounds: Normal breath sounds.  Abdominal:     General: Bowel sounds are normal.     Palpations: Abdomen is soft.     Tenderness: There is no abdominal tenderness.  Musculoskeletal:        General: Normal range of motion.     Cervical back: Normal range of motion.  Skin:    General: Skin is warm and dry.  Neurological:     General: No focal deficit present.     Mental Status: She is alert and oriented to person, place, and time.     Cranial Nerves: No cranial nerve deficit.     Coordination: Coordination normal.     Gait: Gait normal.  Psychiatric:        Mood and Affect: Mood normal.        Behavior: Behavior normal.        Thought Content: Thought content normal.        Judgment: Judgment normal.     Results for orders placed or performed in visit on 02/14/22  HM PAP SMEAR  Result Value Ref Range   HM Pap smear normal       Assessment & Plan:   Problem List Items Addressed This Visit       Cardiovascular and Mediastinum   Primary hypertension    Chronic, stable. BP today 126/84. Continue amlodipine 5mg  daily. Follow-up in 6 months.       Other Visit Diagnoses     Routine general medical examination at a health care facility    -  Primary   Health maintenance reviewed and updated. Utd pap, mammogram. Will updated Td today. Follow-up  1 year    Need for Td vaccine       Td booster updated today   Relevant Orders   Td vaccine greater than or equal to 7yo preservative free IM (Completed)        Follow up plan: Return in about 6 months (around 12/11/2022) for HTN.   LABORATORY TESTING:  - Pap smear: up to date  IMMUNIZATIONS:   - Tdap: Tetanus vaccination status reviewed: Td vaccination indicated and given today. - Influenza: Postponed to flu season - Pneumovax: Not applicable - Prevnar: Not applicable - HPV: Not applicable - Zostavax vaccine: Not applicable  SCREENING: -Mammogram: Done elsewhere  - Colonoscopy: Not applicable  - Bone Density: Not applicable  -Hearing Test: Not applicable  -Spirometry: Not applicable   PATIENT COUNSELING:   Advised to take 1 mg of folate supplement per day if capable of pregnancy.   Sexuality: Discussed sexually transmitted diseases, partner selection, use of condoms, avoidance of unintended pregnancy  and contraceptive alternatives.   Advised to avoid cigarette smoking.  I discussed with the patient that most people either abstain from alcohol or drink within safe limits (<=14/week and <=4 drinks/occasion for males, <=7/weeks and <= 3 drinks/occasion for females) and that the risk for alcohol disorders and other health effects rises proportionally with the number of drinks per week and how often a drinker exceeds daily limits.  Discussed cessation/primary prevention of drug use and availability of treatment for abuse.   Diet: Encouraged to adjust caloric intake to maintain  or achieve ideal body weight, to reduce intake of dietary saturated fat and total fat, to limit sodium intake by avoiding high sodium foods and not adding table salt, and to maintain adequate dietary potassium and calcium preferably from fresh fruits, vegetables, and low-fat dairy products.    stressed the importance of regular exercise  Injury prevention: Discussed safety belts, safety helmets, smoke detector,  smoking near bedding or upholstery.   Dental health: Discussed importance of regular tooth brushing, flossing, and dental visits.    NEXT PREVENTATIVE PHYSICAL DUE IN 1 YEAR. Return in about 6 months (around 12/11/2022) for HTN.

## 2022-06-10 NOTE — Assessment & Plan Note (Signed)
Chronic, stable. BP today 126/84. Continue amlodipine 5mg  daily. Follow-up in 6 months.

## 2022-07-22 DIAGNOSIS — Z124 Encounter for screening for malignant neoplasm of cervix: Secondary | ICD-10-CM | POA: Diagnosis not present

## 2022-07-22 DIAGNOSIS — Z6829 Body mass index (BMI) 29.0-29.9, adult: Secondary | ICD-10-CM | POA: Diagnosis not present

## 2022-07-22 DIAGNOSIS — Z1231 Encounter for screening mammogram for malignant neoplasm of breast: Secondary | ICD-10-CM | POA: Diagnosis not present

## 2022-07-22 DIAGNOSIS — I10 Essential (primary) hypertension: Secondary | ICD-10-CM | POA: Diagnosis not present

## 2022-07-22 DIAGNOSIS — Z1151 Encounter for screening for human papillomavirus (HPV): Secondary | ICD-10-CM | POA: Diagnosis not present

## 2022-07-22 DIAGNOSIS — Z01419 Encounter for gynecological examination (general) (routine) without abnormal findings: Secondary | ICD-10-CM | POA: Diagnosis not present

## 2022-07-22 LAB — HM PAP SMEAR

## 2022-07-22 LAB — RESULTS CONSOLE HPV: CHL HPV: NEGATIVE

## 2022-10-14 ENCOUNTER — Other Ambulatory Visit: Payer: Self-pay | Admitting: Nurse Practitioner

## 2022-12-16 ENCOUNTER — Ambulatory Visit: Payer: BC Managed Care – PPO | Admitting: Nurse Practitioner

## 2022-12-30 ENCOUNTER — Ambulatory Visit: Payer: No Typology Code available for payment source | Admitting: Nurse Practitioner

## 2022-12-30 ENCOUNTER — Encounter: Payer: Self-pay | Admitting: Nurse Practitioner

## 2022-12-30 VITALS — BP 122/78 | HR 78 | Ht 65.0 in | Wt 174.0 lb

## 2022-12-30 DIAGNOSIS — M722 Plantar fascial fibromatosis: Secondary | ICD-10-CM | POA: Diagnosis not present

## 2022-12-30 DIAGNOSIS — I10 Essential (primary) hypertension: Secondary | ICD-10-CM

## 2022-12-30 NOTE — Progress Notes (Signed)
Established Patient Office Visit  Subjective   Patient ID: Tara Morris, female    DOB: 1980-12-30  Age: 42 y.o. MRN: [id number]  Chief Complaint  Patient presents with   Follow-up    6 month follow up for  blood pressure  she had check at work , she is check b/p every other week at home, she said running 116/72    HPI  Tara Morris presents to follow-up on hypertension.  She states that her blood pressure has been doing well, 110s/70s at home.  She has been adjusting her diet and exercising more frequently.  She is hoping to come off of the blood pressure medication.  She denies chest pain, shortness of breath, headaches.  She states that on Saturday morning she woke up with her right heel hurting.  She states when she got out of bed it hurt for when she first started walking and then went away.  She states that has been sore off and on since then.  It is slightly getting better.  Walking after sitting for long periods makes it worse.  She has not tried anything at home.    ROS See pertinent positives and negatives per HPI.    Objective:     BP 122/78   Pulse 78   Ht 5\' 5"  (1.651 m)   Wt 174 lb (78.9 kg)   SpO2 98%   BMI 28.96 kg/m  BP Readings from Last 3 Encounters:  12/30/22 122/78  06/10/22 126/84  03/11/22 129/88   Wt Readings from Last 3 Encounters:  12/30/22 174 lb (78.9 kg)  06/10/22 176 lb 3.2 oz (79.9 kg)  03/11/22 170 lb (77.1 kg)    Physical Exam Vitals and nursing note reviewed.  Constitutional:      General: She is not in acute distress.    Appearance: Normal appearance.  HENT:     Head: Normocephalic.  Eyes:     Conjunctiva/sclera: Conjunctivae normal.  Cardiovascular:     Rate and Rhythm: Normal rate and regular rhythm.     Pulses: Normal pulses.     Heart sounds: Normal heart sounds.  Pulmonary:     Effort: Pulmonary effort is normal.     Breath sounds: Normal breath sounds.  Musculoskeletal:        General: No swelling or  tenderness. Normal range of motion.     Cervical back: Normal range of motion.  Skin:    General: Skin is warm.  Neurological:     General: No focal deficit present.     Mental Status: She is alert and oriented to person, place, and time.  Psychiatric:        Mood and Affect: Mood normal.        Behavior: Behavior normal.        Thought Content: Thought content normal.        Judgment: Judgment normal.     The 10-year ASCVD risk score (Arnett DK, et al., 2019) is: 0.4%    Assessment & Plan:   Problem List Items Addressed This Visit       Cardiovascular and Mediastinum   Primary hypertension - Primary    Chronic, stable.  BP today 122/78.  Will have her stop her amlodipine since she has been making lifestyle changes and see how her blood pressure is doing off medication.  Encouraged her to continue monitoring it at home daily.  Encouraged her to reach out if her blood pressure goes above 409 systolic  for 5 to 7 days in a row.  Continue with exercise and limiting salt in her diet.  Follow-up in 6 months.        Musculoskeletal and Integument   Plantar fasciitis of right foot    She has been having right heel pain since Saturday that is worse when she gets out of bed in the morning.  Most likely plantar fasciitis.  Will have her start using ice and rolling her foot over frozen water bottle or tennis ball several times a day.  She can also take ibuprofen as needed for pain.  Stretches given to do as well.  Follow-up if symptoms worsen or do not improve.       Return in about 6 months (around 06/30/2023) for CPE.    Charyl Dancer, NP

## 2022-12-30 NOTE — Addendum Note (Signed)
Addended by: Vance Peper A on: 12/30/2022 08:30 AM   Modules accepted: Level of Service

## 2022-12-30 NOTE — Assessment & Plan Note (Signed)
Chronic, stable.  BP today 122/78.  Will have her stop her amlodipine since she has been making lifestyle changes and see how her blood pressure is doing off medication.  Encouraged her to continue monitoring it at home daily.  Encouraged her to reach out if her blood pressure goes above 680 systolic for 5 to 7 days in a row.  Continue with exercise and limiting salt in her diet.  Follow-up in 6 months.

## 2022-12-30 NOTE — Patient Instructions (Signed)
It was great to see you!  You can stop the amlodipine and keep checking your blood pressure. If it goes up above 140 for 5-7 days in a row, let me know. Keep up the great work with your exercise.   For your heel, roll it over ice or a tennis ball to help with the pain. You can also take ibuprofen as needed.   Let's follow-up in 6 months, sooner if you have concerns.  If a referral was placed today, you will be contacted for an appointment. Please note that routine referrals can sometimes take up to 3-4 weeks to process. Please call our office if you haven't heard anything after this time frame.  Take care,  Vance Peper, NP

## 2022-12-30 NOTE — Assessment & Plan Note (Signed)
She has been having right heel pain since Saturday that is worse when she gets out of bed in the morning.  Most likely plantar fasciitis.  Will have her start using ice and rolling her foot over frozen water bottle or tennis ball several times a day.  She can also take ibuprofen as needed for pain.  Stretches given to do as well.  Follow-up if symptoms worsen or do not improve.

## 2023-01-23 ENCOUNTER — Telehealth: Payer: Self-pay | Admitting: Nurse Practitioner

## 2023-01-23 NOTE — Telephone Encounter (Signed)
LVM for patient to RTC

## 2023-01-24 NOTE — Telephone Encounter (Signed)
Noted  

## 2023-01-24 NOTE — Telephone Encounter (Signed)
I spoke with patient and she said that she had spoken with someone this morning about this message. She said that she doesn't need a refill and she still has some pills that she will finish her bottle.

## 2023-01-29 ENCOUNTER — Other Ambulatory Visit: Payer: Self-pay | Admitting: Nurse Practitioner

## 2023-01-29 NOTE — Telephone Encounter (Signed)
LVM for patient to return call. 

## 2023-01-31 NOTE — Telephone Encounter (Signed)
I spoke with patient and she wanted to complete her bottle of medication and the stop taking it for now. Patient will call and make an appointment later if need too.

## 2023-02-01 IMAGING — MG MM DIGITAL DIAGNOSTIC UNILAT*L* W/ TOMO W/ CAD
4 series · 4 of 12 positions shown · non-contrast
Comparison: Previous exam(s).

CLINICAL DATA: 39-year-old female for further evaluation of
possible LEFT breast mass on screening mammogram.

EXAM:
DIGITAL DIAGNOSTIC UNILATERAL LEFT MAMMOGRAM WITH TOMOSYNTHESIS AND
CAD; ULTRASOUND LEFT BREAST LIMITED
TECHNIQUE: Left digital diagnostic mammography and breast tomosynthesis was
performed. The images were evaluated with computer-aided detection.;
Targeted ultrasound examination of the left breast was performed

[L CC synth-2D]
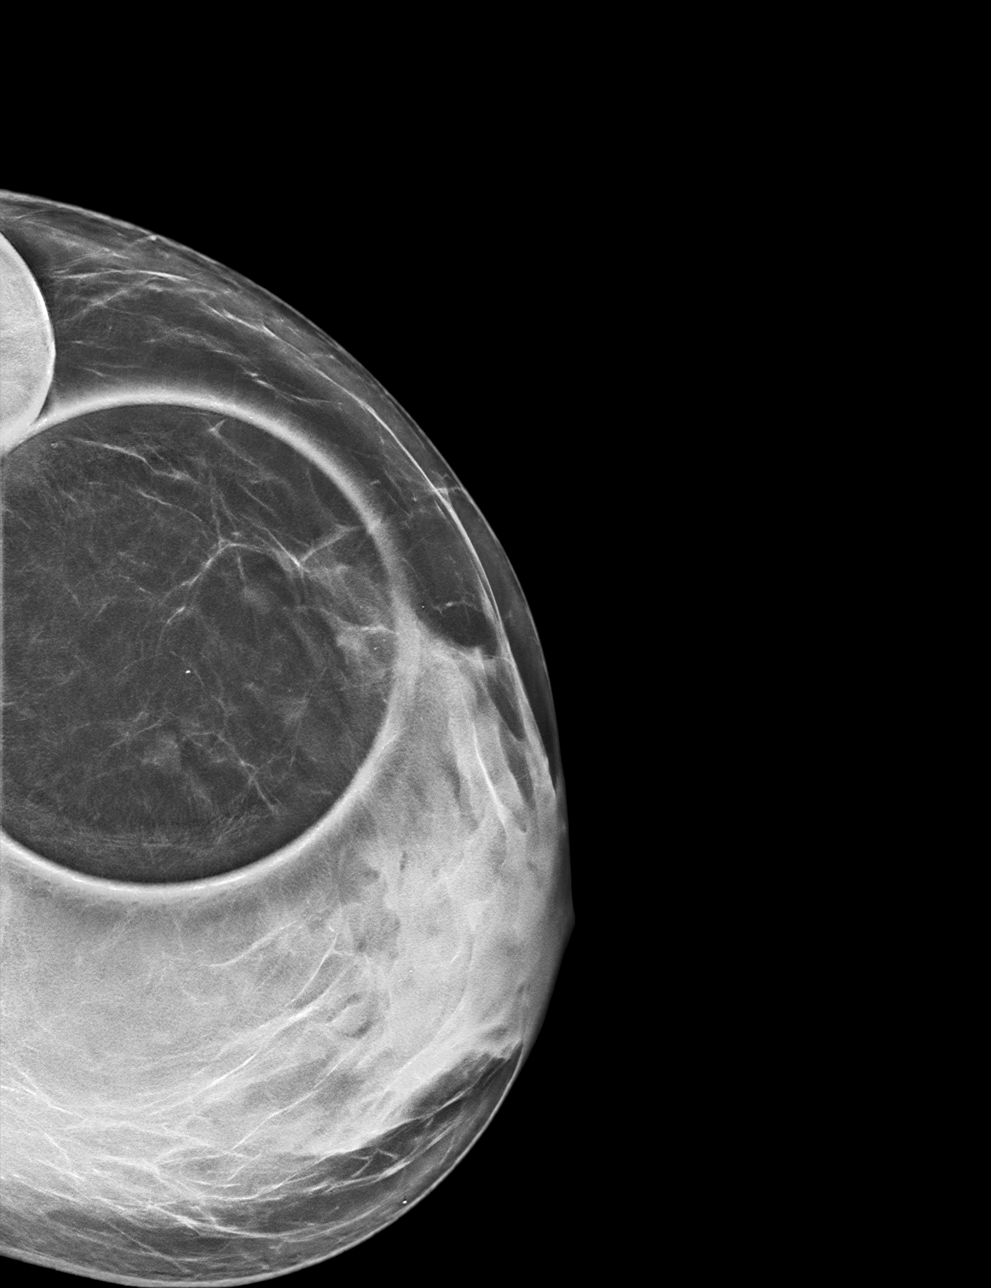

[L MLO synth-2D]
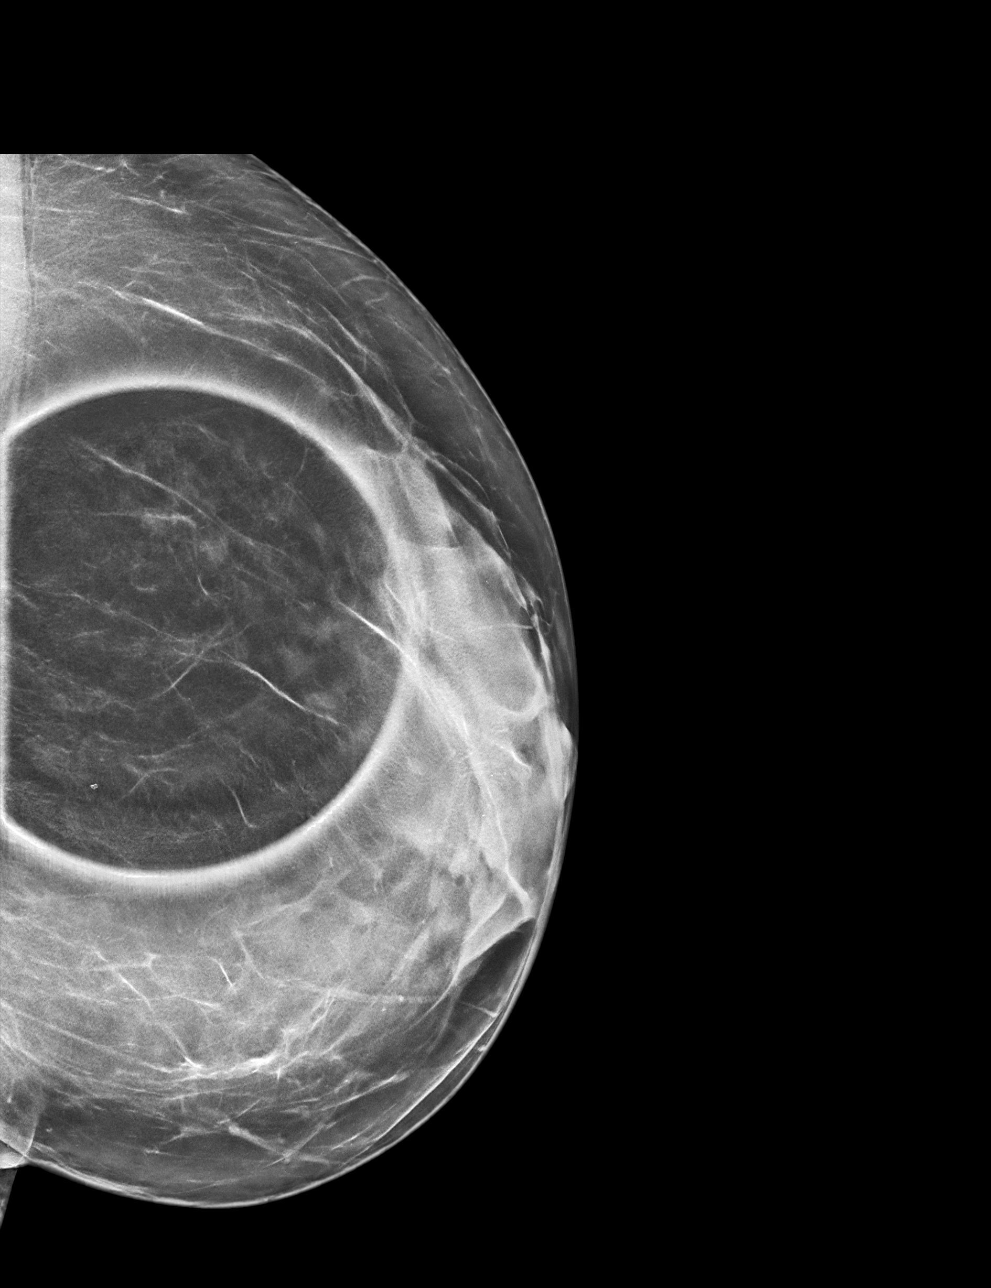

[L MLO tomo · tomo slice 34/67.0]
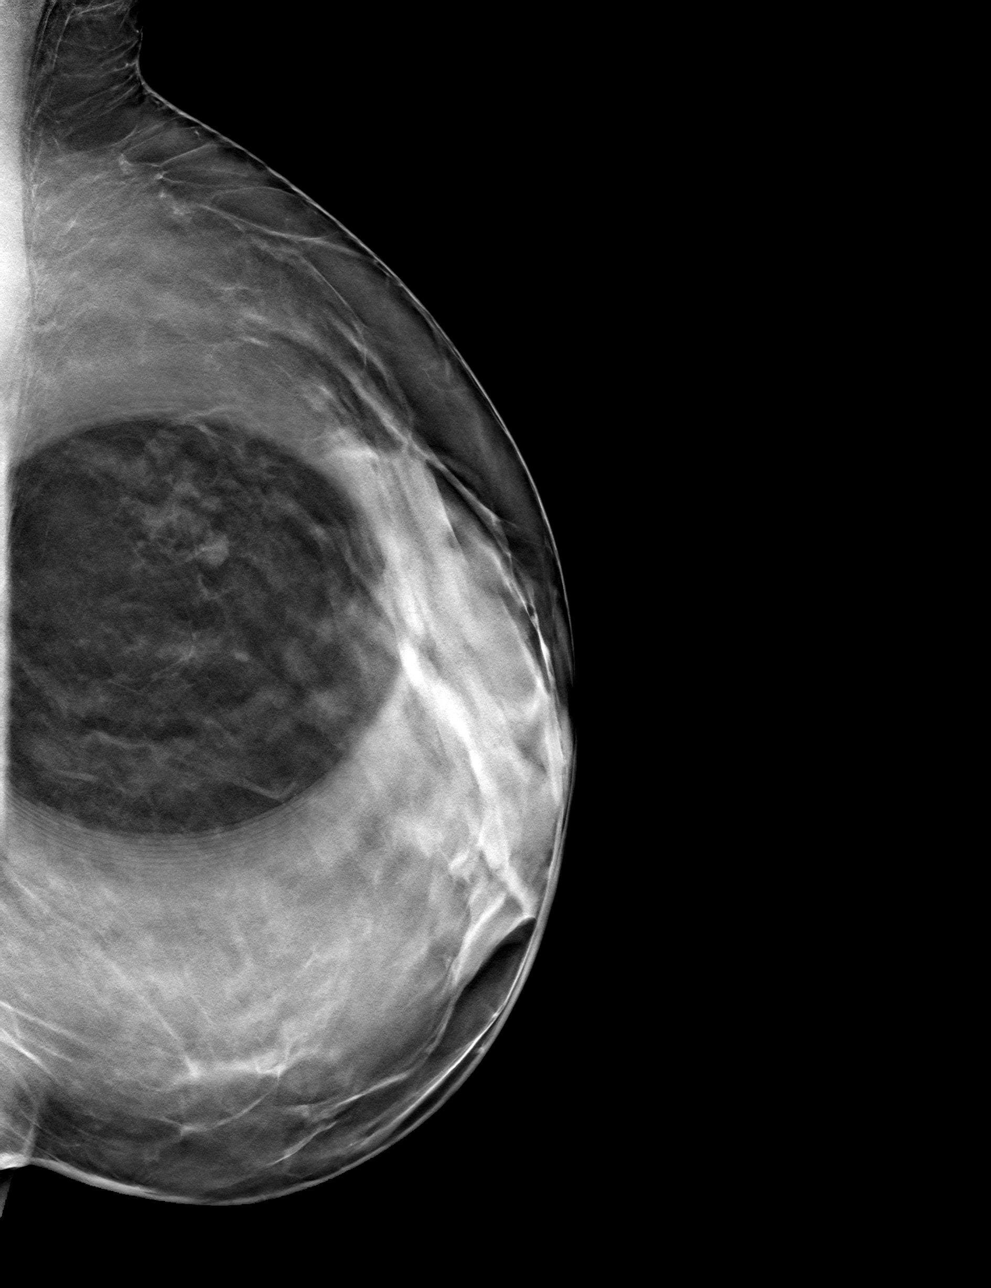

[L CC tomo · tomo slice 34/67.0]
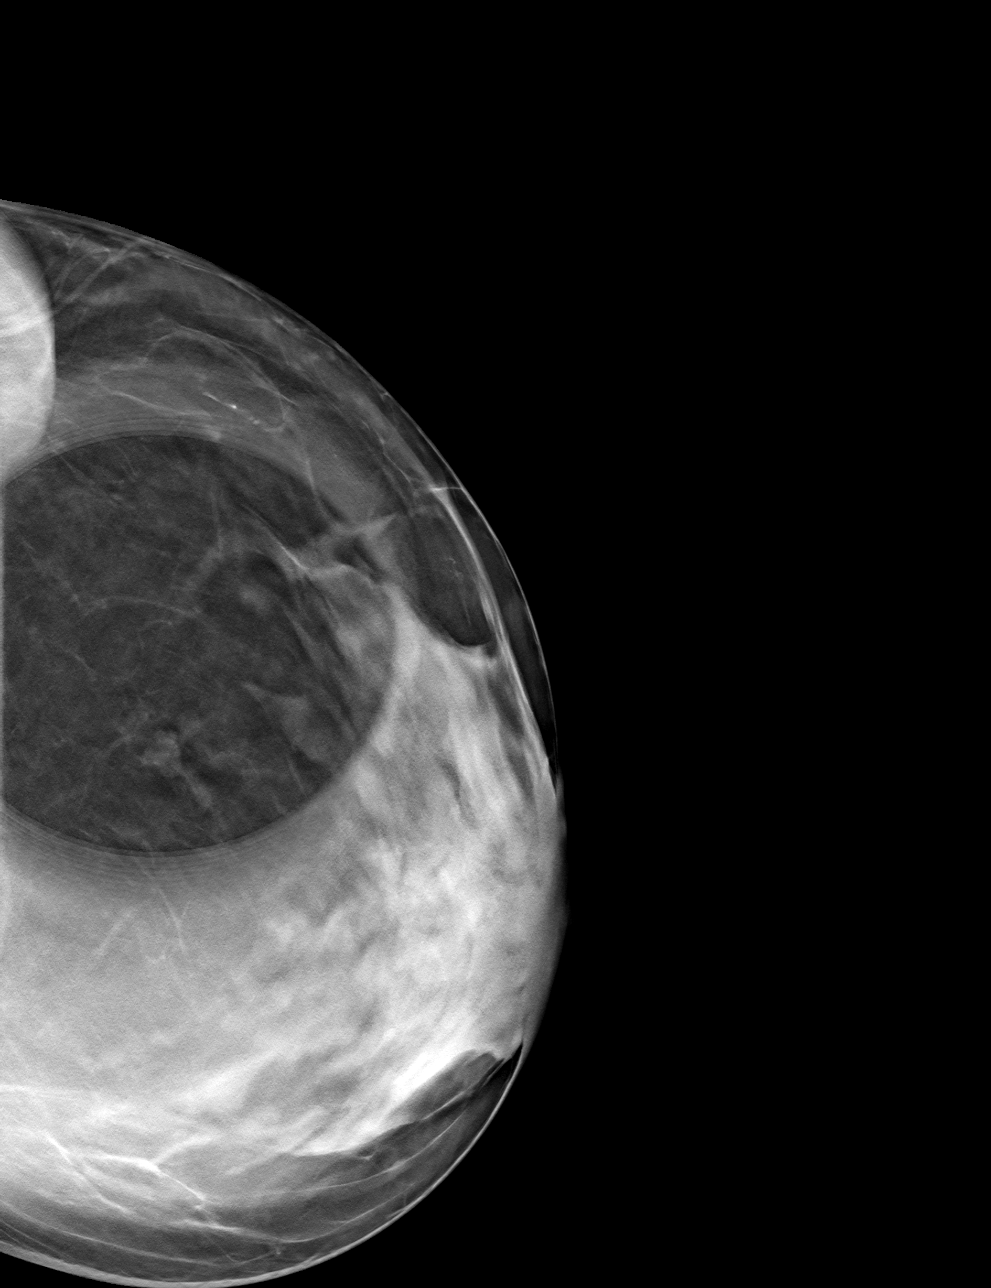

[4 of 12 positions shown; findings below may reference images not displayed]

ACR Breast Density Category c: The breast tissue is heterogeneously
dense, which may obscure small masses.
FINDINGS: 2D/3D spot compression views of the LEFT breast demonstrate a
persistent circumscribed oval mass within the UPPER-OUTER LEFT
breast.

Targeted ultrasound is performed, showing a 0.6 x 0.2 x 0.5 cm
benign cyst at the 2 o'clock position of the LEFT breast 3 cm from
the nipple, a good correlate to the screening study finding. No
other abnormalities in the UPPER OUTER LEFT breast identified.
IMPRESSION: Benign cyst within the UPPER-OUTER LEFT breast, a good correlate to
the screening study finding.

RECOMMENDATION:
Bilateral screening mammogram in 1 year.

I have discussed the findings and recommendations with the patient.
If applicable, a reminder letter will be sent to the patient
regarding the next appointment.

BI-RADS CATEGORY  2: Benign.

## 2023-02-01 IMAGING — US US BREAST*L* LIMITED INC AXILLA
1 series · 8 of 8 positions shown · non-contrast
Comparison: Previous exam(s).

CLINICAL DATA: 39-year-old female for further evaluation of
possible LEFT breast mass on screening mammogram.

EXAM:
DIGITAL DIAGNOSTIC UNILATERAL LEFT MAMMOGRAM WITH TOMOSYNTHESIS AND
CAD; ULTRASOUND LEFT BREAST LIMITED
TECHNIQUE: Left digital diagnostic mammography and breast tomosynthesis was
performed. The images were evaluated with computer-aided detection.;
Targeted ultrasound examination of the left breast was performed

[Series 1: us breast*left* limited inc axilla · 0.06mm/px · 8 of 8 slices shown]
[im 1/8]
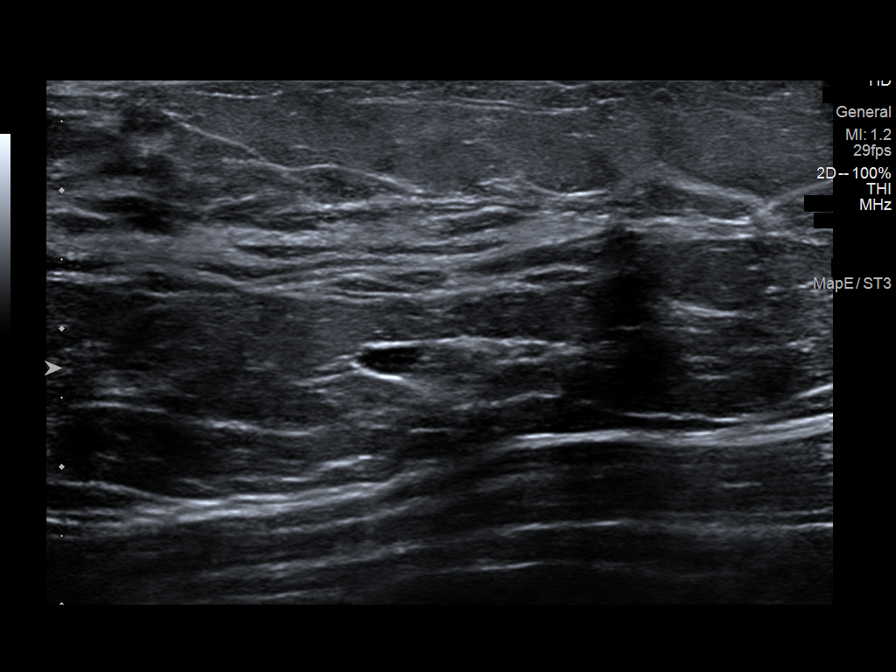
[im 2/8]
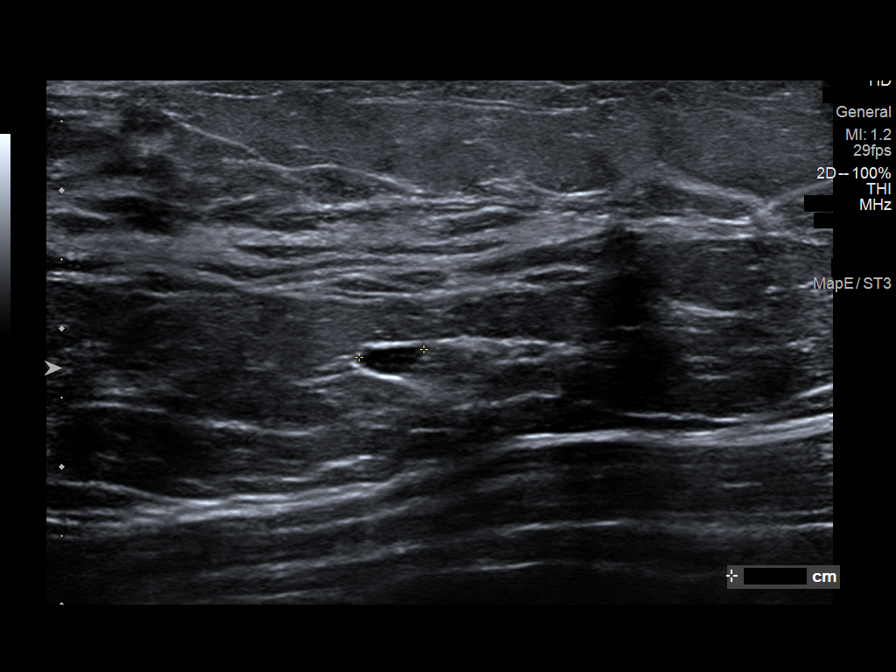
[im 3/8]
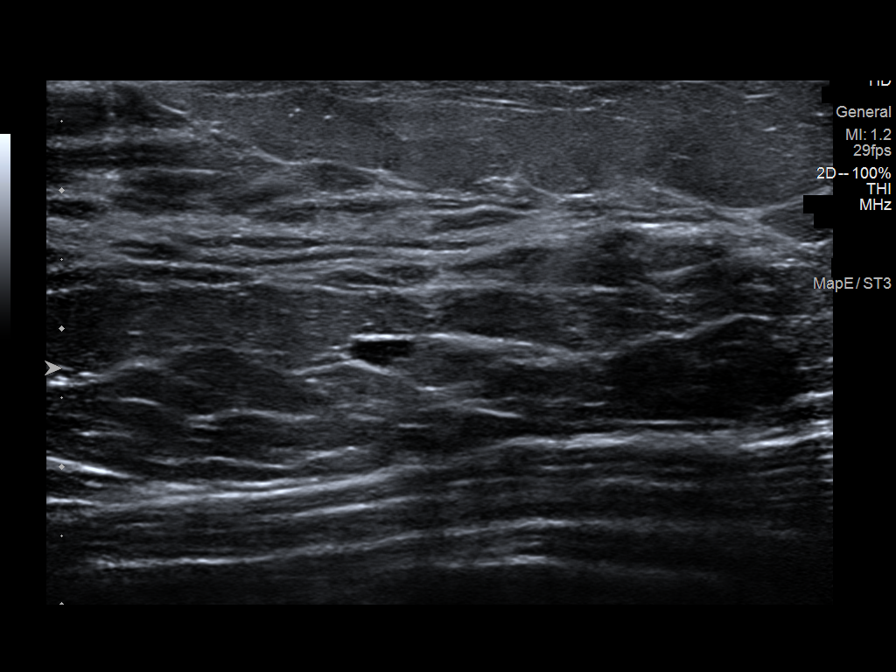
[im 4/8]
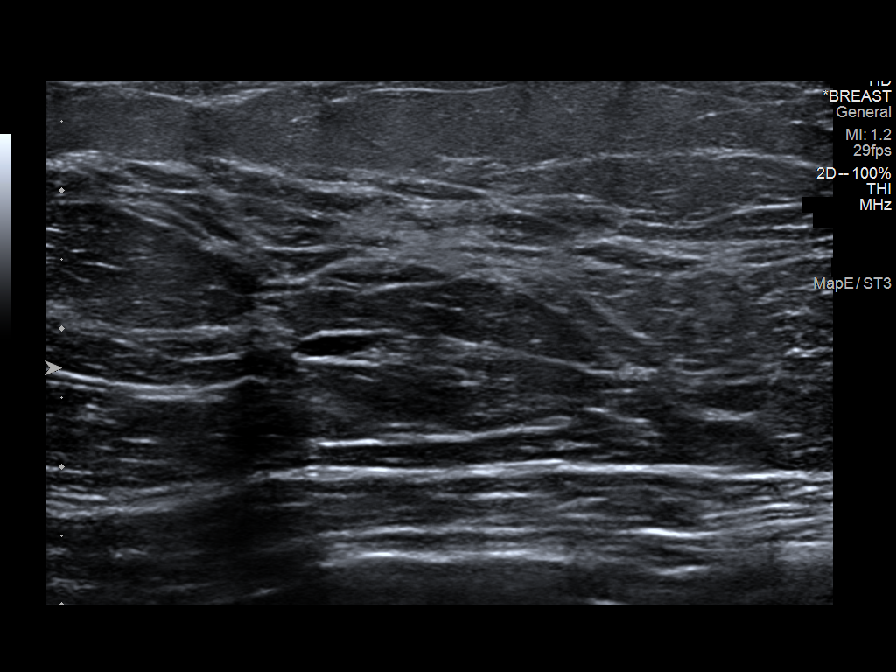
[im 5/8]
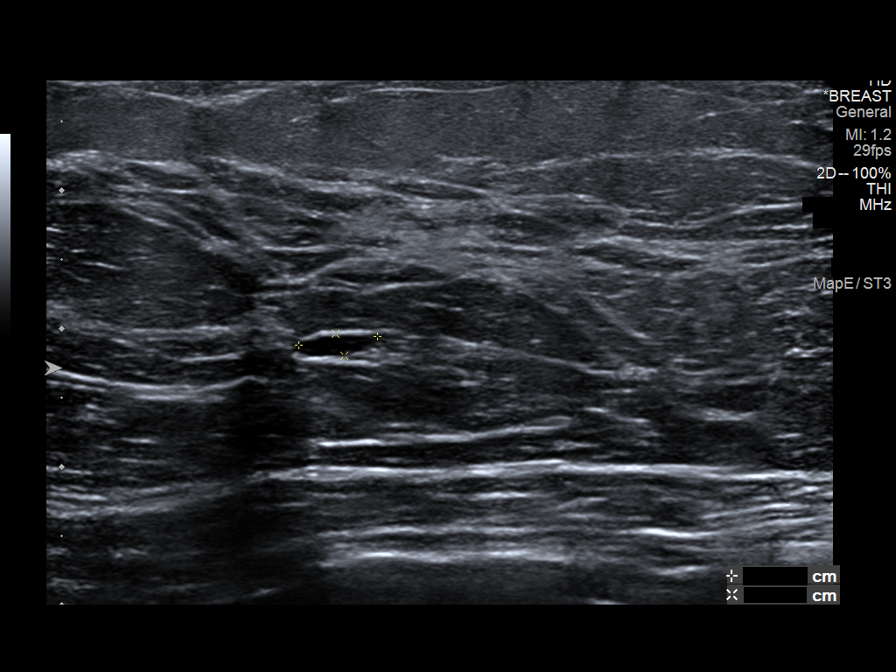
[im 6/8]
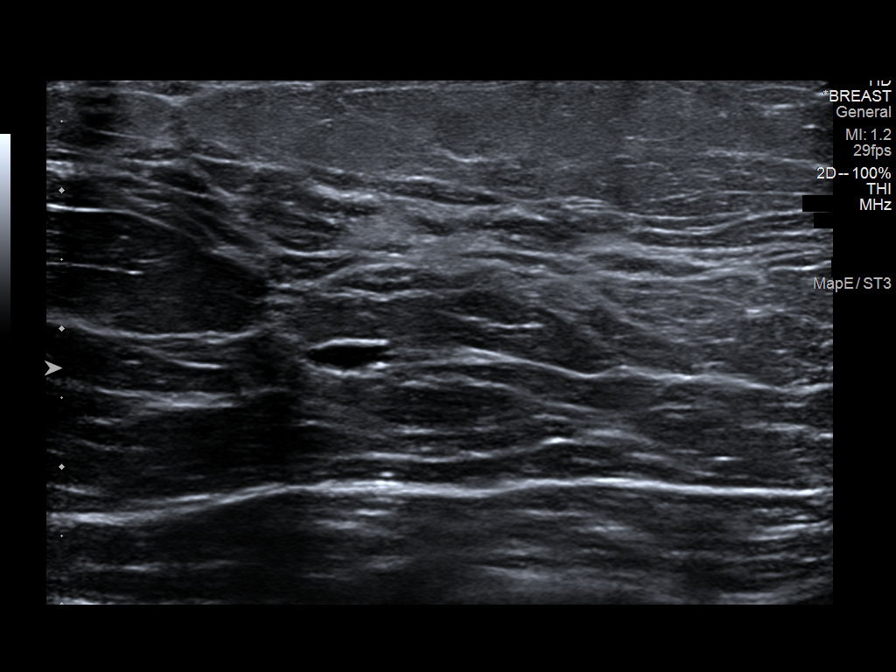
[im 7/8]
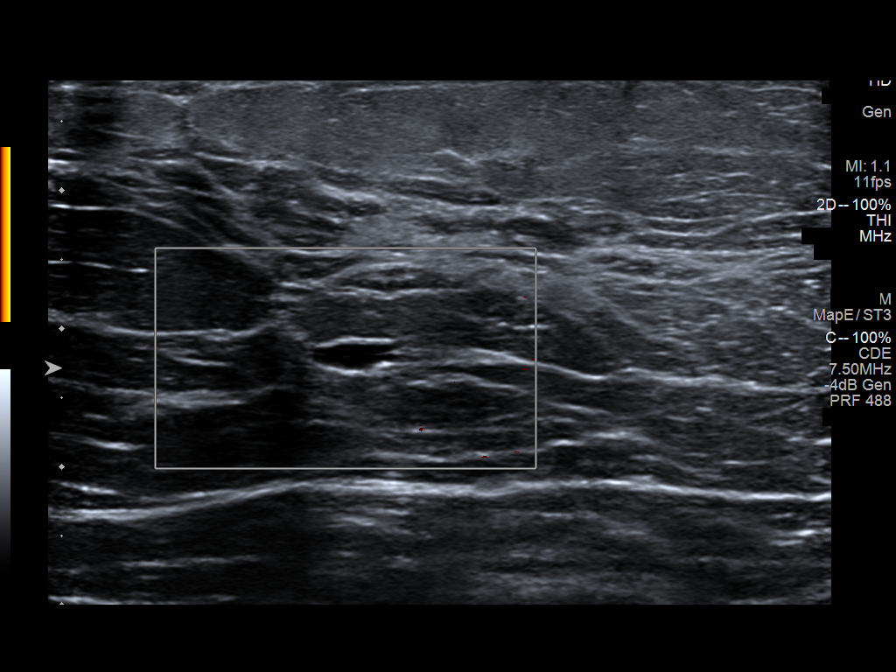
[im 8/8]
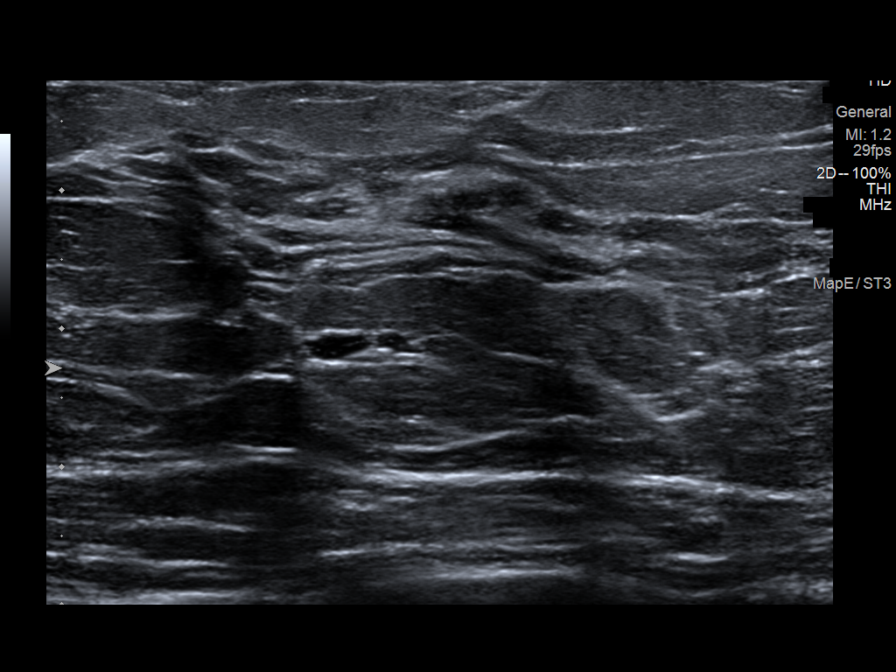

[8 of 8 positions shown; findings below may reference images not displayed]

ACR Breast Density Category c: The breast tissue is heterogeneously
dense, which may obscure small masses.
FINDINGS: 2D/3D spot compression views of the LEFT breast demonstrate a
persistent circumscribed oval mass within the UPPER-OUTER LEFT
breast.

Targeted ultrasound is performed, showing a 0.6 x 0.2 x 0.5 cm
benign cyst at the 2 o'clock position of the LEFT breast 3 cm from
the nipple, a good correlate to the screening study finding. No
other abnormalities in the UPPER OUTER LEFT breast identified.
IMPRESSION: Benign cyst within the UPPER-OUTER LEFT breast, a good correlate to
the screening study finding.

RECOMMENDATION:
Bilateral screening mammogram in 1 year.

I have discussed the findings and recommendations with the patient.
If applicable, a reminder letter will be sent to the patient
regarding the next appointment.

BI-RADS CATEGORY  2: Benign.

## 2023-06-30 ENCOUNTER — Telehealth: Payer: Self-pay | Admitting: Nurse Practitioner

## 2023-06-30 ENCOUNTER — Encounter: Payer: No Typology Code available for payment source | Admitting: Nurse Practitioner

## 2023-06-30 NOTE — Telephone Encounter (Signed)
NS 7/22 arrived at 8:12 letter sent.

## 2023-06-30 NOTE — Telephone Encounter (Signed)
[employer] [phone]  Pt wanted to express she doesn't understand why she had to reschedule, "she was only 2 min past the grace period." I stated that we can not cater to one and not another we have to be fair across the board.  She is requesting some traveling meds for traveling out of the country, for stomach and nausea.

## 2023-06-30 NOTE — Telephone Encounter (Signed)
I called and spoke with patient and scheduled her a virtual visit.

## 2023-07-02 ENCOUNTER — Encounter: Payer: Self-pay | Admitting: Nurse Practitioner

## 2023-07-02 ENCOUNTER — Telehealth: Payer: No Typology Code available for payment source | Admitting: Nurse Practitioner

## 2023-07-02 ENCOUNTER — Other Ambulatory Visit: Payer: Self-pay | Admitting: Nurse Practitioner

## 2023-07-02 DIAGNOSIS — I10 Essential (primary) hypertension: Secondary | ICD-10-CM

## 2023-07-02 DIAGNOSIS — Z7184 Encounter for health counseling related to travel: Secondary | ICD-10-CM | POA: Diagnosis not present

## 2023-07-02 MED ORDER — AMLODIPINE BESYLATE 5 MG PO TABS: 5.0000 mg | ORAL_TABLET | Freq: Every day | ORAL | 0 refills | Status: DC | PRN

## 2023-07-02 MED ORDER — AZITHROMYCIN 500 MG PO TABS: 500.0000 mg | ORAL_TABLET | Freq: Every day | ORAL | 0 refills | Status: DC

## 2023-07-02 MED ORDER — ONDANSETRON HCL 4 MG PO TABS: 4.0000 mg | ORAL_TABLET | Freq: Three times a day (TID) | ORAL | 0 refills | Status: AC | PRN

## 2023-07-02 NOTE — Telephone Encounter (Signed)
late arrival, 1st missed visit, fee waived

## 2023-07-02 NOTE — Telephone Encounter (Signed)
Noted  

## 2023-07-02 NOTE — Assessment & Plan Note (Signed)
Chronic, ongoing.  She has been still trying to make lifestyle changes, however her blood pressure has been elevated in the 130s to 140s at home.  She is going to the [place] and is concerned about her blood pressure being elevated while there.  Will represcribe amlodipine 5 mg daily to take as needed if her blood pressure stays above 140s.  Recommend that she follow-up when she gets back from her trip.

## 2023-07-02 NOTE — Progress Notes (Signed)
[facility] [address] [place] [address] Dept: [phone] Dept Fax: [phone]  Virtual Video Visit  I connected with Tara Morris on 07/02/23 at  4:20 PM EDT by a video enabled telemedicine application and verified that I am speaking with the correct person using two identifiers.  Location patient: Home Location provider: Clinic Persons participating in the virtual visit: Patient; Rodman Pickle, NP; Malena Peer, CMA  I discussed the limitations of evaluation and management by telemedicine and the availability of in person appointments. The patient expressed understanding and agreed to proceed.  Chief Complaint  Patient presents with   Travel Consult    Going to the [place] weeks    SUBJECTIVE:  HPI: Tara Morris is a 42 y.o. female who presents to discuss medications to have on hand if needed when she goes to the [place].  She states that her blood pressure has been slightly elevated in the 130s-140s.  She was taking amlodipine in the past, however has been off of this.  She is also concerned about possibly getting traveler's diarrhea.  She is not having any symptoms currently.  Patient Active Problem List   Diagnosis Date Noted   Plantar fasciitis of right foot 12/30/2022   Iron deficiency anemia due to chronic blood loss 09/23/2015   Primary hypertension 07/25/2015   Headache 02/28/2012   Acne 02/28/2012    Past Surgical History:  Procedure Laterality Date   BREAST REDUCTION SURGERY  2003    Family History  Problem Relation Age of Onset   Hypertension Mother    Hyperlipidemia Maternal Grandmother    Hypertension Maternal Grandmother    Cancer Paternal Grandfather        prostate    Social History   Tobacco Use   Smoking status: Never   Smokeless tobacco: Never  Vaping Use   Vaping status: Never Used  Substance Use Topics   Alcohol use: Yes     Comment: socially   Drug use: Never     Current Outpatient Medications:    amLODipine (NORVASC) 5 MG tablet, Take 1 tablet (5 mg total) by mouth daily as needed (blood pressure >140)., Disp: 30 tablet, Rfl: 0   azithromycin (ZITHROMAX) 500 MG tablet, Take 1 tablet (500 mg total) by mouth daily. Take 1 tablet daily if needed for travelers diarrhea, Disp: 5 tablet, Rfl: 0   ondansetron (ZOFRAN) 4 MG tablet, Take 1 tablet (4 mg total) by mouth every 8 (eight) hours as needed for nausea or vomiting., Disp: 30 tablet, Rfl: 0   LO LOESTRIN FE 1 MG-10 MCG / 10 MCG tablet, Take 1 tablet by mouth daily., Disp: , Rfl:    Multiple Vitamin (MULTIVITAMIN ADULT PO), , Disp: , Rfl:   No Known Allergies  ROS: See pertinent positives and negatives per HPI.  OBSERVATIONS/OBJECTIVE:  VITALS per patient if applicable: There were no vitals filed for this visit. There is no height or weight on file to calculate BMI.    GENERAL: Alert and oriented. Appears well and in no acute distress.  HEENT: Atraumatic. Conjunctiva clear. No obvious abnormalities on inspection of external nose and ears.  NECK: Normal movements of the head and neck.  LUNGS: On inspection, no signs of respiratory distress. Breathing rate appears normal. No obvious gross SOB, gasping or wheezing, and no conversational dyspnea.  CV: No obvious cyanosis.  MS: Moves all visible extremities without noticeable abnormality.  PSYCH/NEURO: Pleasant and  cooperative. No obvious depression or anxiety. Speech and thought processing grossly intact.  ASSESSMENT AND PLAN:  Problem List Items Addressed This Visit       Cardiovascular and Mediastinum   Primary hypertension - Primary    Chronic, ongoing.  She has been still trying to make lifestyle changes, however her blood pressure has been elevated in the 130s to 140s at home.  She is going to the [place] and is concerned about her blood pressure being elevated while there.   Will represcribe amlodipine 5 mg daily to take as needed if her blood pressure stays above 140s.  Recommend that she follow-up when she gets back from her trip.      Relevant Medications   amLODipine (NORVASC) 5 MG tablet   Other Visit Diagnoses     Travel advice encounter       Can use pepto-bismol for travelers diarrhea prn. Will also give her zithromax 500mg  daily if the pepto bismol doesn't work. Rec. freuqent hand washing        I discussed the assessment and treatment plan with the patient. The patient was provided an opportunity to ask questions and all were answered. The patient agreed with the plan and demonstrated an understanding of the instructions.   The patient was advised to call back or seek an in-person evaluation if the symptoms worsen or if the condition fails to improve as anticipated.   Gerre Scull, NP

## 2023-07-02 NOTE — Patient Instructions (Signed)
It was great to see you!  I have sent in Zofran as needed for nausea and Zithromax, take 1 tablet daily if you get traveler's diarrhea.  You can also take Pepto-Bismol for traveler's diarrhea first and see if this helps with the symptoms.  Make sure you are drinking plenty of water.  Let's follow-up with any concerns   Take care,  Rodman Pickle, NP

## 2023-07-03 NOTE — Telephone Encounter (Signed)
Requesting: AMLODIPINE BESYLATE 5MG  TABLETS  Last Visit: 07/02/2023 Next Visit: 08/15/2023 Last Refill: 07/02/2023  Please Advise   To pharmacy: **Patient requests 90 days supply**

## 2023-07-24 ENCOUNTER — Encounter (INDEPENDENT_AMBULATORY_CARE_PROVIDER_SITE_OTHER): Payer: Self-pay

## 2023-08-15 ENCOUNTER — Encounter: Payer: Self-pay | Admitting: Nurse Practitioner

## 2023-08-15 ENCOUNTER — Ambulatory Visit (INDEPENDENT_AMBULATORY_CARE_PROVIDER_SITE_OTHER): Payer: No Typology Code available for payment source | Admitting: Nurse Practitioner

## 2023-08-15 VITALS — BP 134/84 | HR 70 | Temp 97.5°F | Ht 64.5 in | Wt 174.8 lb

## 2023-08-15 DIAGNOSIS — Z0000 Encounter for general adult medical examination without abnormal findings: Secondary | ICD-10-CM | POA: Insufficient documentation

## 2023-08-15 DIAGNOSIS — I10 Essential (primary) hypertension: Secondary | ICD-10-CM

## 2023-08-15 NOTE — Patient Instructions (Signed)
It was great to see you!  Keep up the great work!   Let's follow-up in 1 year, sooner if you have concerns.  If a referral was placed today, you will be contacted for an appointment. Please note that routine referrals can sometimes take up to 3-4 weeks to process. Please call our office if you haven't heard anything after this time frame.  Take care,  Lauren McElwee, NP  

## 2023-08-15 NOTE — Progress Notes (Signed)
BP 134/84 (BP Location: Left Arm, Cuff Size: Normal)   Pulse 70   Temp (!) 97.5 F (36.4 C)   Ht 5' 4.5" (1.638 m)   Wt 174 lb 12.8 oz (79.3 kg)   LMP 08/15/2023 (Exact Date)   SpO2 100%   BMI 29.54 kg/m    Subjective:    Patient ID: Tara Morris, female    DOB: [birth year], 42 y.o.   MRN: [id number]  CC: Chief Complaint  Patient presents with   Annual Exam    No concerns    HPI: Tara Morris is a 42 y.o. female presenting on 08/15/2023 for comprehensive medical examination. Current medical complaints include:none  She currently lives with: alone Menopausal Symptoms: no  Depression and Anxiety Screen done today and results listed below:     08/15/2023    8:37 AM 06/10/2022    8:30 AM 02/11/2022    8:47 AM  Depression screen PHQ 2/9  Decreased Interest 0 0 0  Down, Depressed, Hopeless 0 0 0  PHQ - 2 Score 0 0 0  Altered sleeping 0  0  Tired, decreased energy 0  0  Change in appetite 0  0  Feeling bad or failure about yourself  0  0  Trouble concentrating 0  0  Moving slowly or fidgety/restless 0  0  Suicidal thoughts 0  0  PHQ-9 Score 0  0      08/15/2023    8:38 AM 06/10/2022    8:30 AM 02/11/2022    8:47 AM  GAD 7 : Generalized Anxiety Score  Nervous, Anxious, on Edge 0 0 0  Control/stop worrying 0 0 0  Worry too much - different things 0 0 0  Trouble relaxing 0 0 0  Restless 0 0 0  Easily annoyed or irritable 0 0 0  Afraid - awful might happen 0 0 0  Total GAD 7 Score 0 0 0  Anxiety Difficulty  Not difficult at all     The patient does not have a history of falls. I did not complete a risk assessment for falls. A plan of care for falls was not documented.   Past Medical History:  Past Medical History:  Diagnosis Date   Hypertension     Surgical History:  Past Surgical History:  Procedure Laterality Date   BREAST REDUCTION SURGERY  2003    Medications:  Current Outpatient Medications on File Prior to Visit  Medication Sig   LO LOESTRIN FE 1  MG-10 MCG / 10 MCG tablet Take 1 tablet by mouth daily.   Multiple Vitamin (MULTIVITAMIN ADULT PO)    ondansetron (ZOFRAN) 4 MG tablet Take 1 tablet (4 mg total) by mouth every 8 (eight) hours as needed for nausea or vomiting. (Patient not taking: Reported on 08/15/2023)   No current facility-administered medications on file prior to visit.    Allergies:  No Known Allergies  Social History:  Social History   Socioeconomic History   Marital status: Single    Spouse name: Not on file   Number of children: Not on file   Years of education: Not on file   Highest education level: Not on file  Occupational History   Not on file  Tobacco Use   Smoking status: Never   Smokeless tobacco: Never  Vaping Use   Vaping status: Never Used  Substance and Sexual Activity   Alcohol use: Yes    Comment: socially   Drug use: Never   Sexual  activity: Not on file  Other Topics Concern   Not on file  Social History Narrative   Not on file   Social Determinants of Health   Financial Resource Strain: Not on file  Food Insecurity: Not on file  Transportation Needs: Not on file  Physical Activity: Not on file  Stress: Not on file  Social Connections: Not on file  Intimate Partner Violence: Not on file   Social History   Tobacco Use  Smoking Status Never  Smokeless Tobacco Never   Social History   Substance and Sexual Activity  Alcohol Use Yes   Comment: socially    Family History:  Family History  Problem Relation Age of Onset   Hypertension Mother    Hyperlipidemia Maternal Grandmother    Hypertension Maternal Grandmother    Cancer Paternal Grandfather        prostate    Past medical history, surgical history, medications, allergies, family history and social history reviewed with patient today and changes made to appropriate areas of the chart.   Review of Systems  Constitutional: Negative.   HENT: Negative.    Eyes: Negative.   Respiratory: Negative.     Cardiovascular: Negative.   Gastrointestinal: Negative.   Genitourinary: Negative.   Musculoskeletal: Negative.   Skin: Negative.   Neurological: Negative.   Psychiatric/Behavioral: Negative.     All other ROS negative except what is listed above and in the HPI.      Objective:    BP 134/84 (BP Location: Left Arm, Cuff Size: Normal)   Pulse 70   Temp (!) 97.5 F (36.4 C)   Ht 5' 4.5" (1.638 m)   Wt 174 lb 12.8 oz (79.3 kg)   LMP 08/15/2023 (Exact Date)   SpO2 100%   BMI 29.54 kg/m   Wt Readings from Last 3 Encounters:  08/15/23 174 lb 12.8 oz (79.3 kg)  12/30/22 174 lb (78.9 kg)  06/10/22 176 lb 3.2 oz (79.9 kg)    Physical Exam Vitals and nursing note reviewed.  Constitutional:      General: She is not in acute distress.    Appearance: Normal appearance.  HENT:     Head: Normocephalic and atraumatic.     Right Ear: Tympanic membrane, ear canal and external ear normal.     Left Ear: Tympanic membrane, ear canal and external ear normal.     Mouth/Throat:     Mouth: Mucous membranes are moist.     Pharynx: No oropharyngeal exudate or posterior oropharyngeal erythema.  Eyes:     Conjunctiva/sclera: Conjunctivae normal.  Cardiovascular:     Rate and Rhythm: Normal rate and regular rhythm.     Pulses: Normal pulses.     Heart sounds: Normal heart sounds.  Pulmonary:     Effort: Pulmonary effort is normal.     Breath sounds: Normal breath sounds.  Abdominal:     Palpations: Abdomen is soft.     Tenderness: There is no abdominal tenderness.  Musculoskeletal:        General: Normal range of motion.     Cervical back: Normal range of motion and neck supple.     Right lower leg: No edema.     Left lower leg: No edema.  Lymphadenopathy:     Cervical: No cervical adenopathy.  Skin:    General: Skin is warm and dry.  Neurological:     General: No focal deficit present.     Mental Status: She is alert and oriented to person,  place, and time.     Cranial Nerves: No  cranial nerve deficit.     Coordination: Coordination normal.     Gait: Gait normal.  Psychiatric:        Mood and Affect: Mood normal.        Behavior: Behavior normal.        Thought Content: Thought content normal.        Judgment: Judgment normal.     Results for orders placed or performed in visit on 02/14/22  HM PAP SMEAR  Result Value Ref Range   HM Pap smear normal       Assessment & Plan:   Problem List Items Addressed This Visit       Cardiovascular and Mediastinum   Primary hypertension    Chronic, stable. Continue focus on nutrition and exercise. Follow-up 1 year        Other   Routine general medical examination at a health care facility - Primary    Health maintenance reviewed and updated. Discussed nutrition, exercise. Labs from last year reviewed. Follow-up 1 year.          Follow up plan: Return in about 1 year (around 08/14/2024) for CPE.   LABORATORY TESTING:  - Pap smear: up to date  IMMUNIZATIONS:   - Tdap: Tetanus vaccination status reviewed: last tetanus booster within 10 years. - Influenza:  declined - Pneumovax: Not applicable - Prevnar: Not applicable - HPV: Not applicable - Shingrix vaccine: Not applicable  SCREENING: -Mammogram: Done elsewhere  - Colonoscopy: Not applicable  - Bone Density: Not applicable   PATIENT COUNSELING:   Advised to take 1 mg of folate supplement per day if capable of pregnancy.   Sexuality: Discussed sexually transmitted diseases, partner selection, use of condoms, avoidance of unintended pregnancy  and contraceptive alternatives.   Advised to avoid cigarette smoking.  I discussed with the patient that most people either abstain from alcohol or drink within safe limits (<=14/week and <=4 drinks/occasion for males, <=7/weeks and <= 3 drinks/occasion for females) and that the risk for alcohol disorders and other health effects rises proportionally with the number of drinks per week and how often a drinker  exceeds daily limits.  Discussed cessation/primary prevention of drug use and availability of treatment for abuse.   Diet: Encouraged to adjust caloric intake to maintain  or achieve ideal body weight, to reduce intake of dietary saturated fat and total fat, to limit sodium intake by avoiding high sodium foods and not adding table salt, and to maintain adequate dietary potassium and calcium preferably from fresh fruits, vegetables, and low-fat dairy products.    stressed the importance of regular exercise  Injury prevention: Discussed safety belts, safety helmets, smoke detector, smoking near bedding or upholstery.   Dental health: Discussed importance of regular tooth brushing, flossing, and dental visits.    NEXT PREVENTATIVE PHYSICAL DUE IN 1 YEAR. Return in about 1 year (around 08/14/2024) for CPE.  Andrei Mccook A Ryli Standlee

## 2023-08-15 NOTE — Assessment & Plan Note (Signed)
Health maintenance reviewed and updated. Discussed nutrition, exercise. Labs from last year reviewed. Follow-up 1 year.

## 2023-08-15 NOTE — Assessment & Plan Note (Signed)
Chronic, stable. Continue focus on nutrition and exercise. Follow-up 1 year

## 2023-09-08 LAB — HM MAMMOGRAPHY

## 2023-09-10 ENCOUNTER — Encounter: Payer: Self-pay | Admitting: Nurse Practitioner

## 2024-01-05 LAB — BASIC METABOLIC PANEL WITH GFR
BUN: 15 (ref 4–21)
Chloride: 102 (ref 99–108)
Creatinine: 1 (ref 0.5–1.1)
Glucose: 97
Potassium: 4.4 meq/L (ref 3.5–5.1)
Sodium: 139 (ref 137–147)

## 2024-01-05 LAB — CBC: RBC: 5.07 (ref 3.87–5.11)

## 2024-01-05 LAB — CBC AND DIFFERENTIAL
HCT: 41 (ref 36–46)
Hemoglobin: 12.2 (ref 12.0–16.0)
Neutrophils Absolute: 5
Platelets: 267 K/uL (ref 150–400)
WBC: 8.8

## 2024-01-05 LAB — LIPID PANEL
Cholesterol: 156 (ref 0–200)
HDL: 51 (ref 35–70)
LDL Cholesterol: 91
LDl/HDL Ratio: 3.1
Triglycerides: 69 (ref 40–160)

## 2024-01-05 LAB — COMPREHENSIVE METABOLIC PANEL WITH GFR
Albumin: 4.5 (ref 3.5–5.0)
Calcium: 9.3 (ref 8.7–10.7)
Globulin: 2.4

## 2024-01-05 LAB — TSH: TSH: 3.59 (ref 0.41–5.90)

## 2024-01-05 LAB — HEPATIC FUNCTION PANEL
ALT: 15 U/L (ref 7–35)
AST: 23 (ref 13–35)

## 2024-03-12 ENCOUNTER — Encounter: Payer: Self-pay | Admitting: Nurse Practitioner

## 2024-03-12 ENCOUNTER — Ambulatory Visit: Admitting: Nurse Practitioner

## 2024-03-12 VITALS — BP 140/90 | HR 73 | Temp 97.3°F | Ht 64.5 in | Wt 176.4 lb

## 2024-03-12 DIAGNOSIS — I10 Essential (primary) hypertension: Secondary | ICD-10-CM | POA: Diagnosis not present

## 2024-03-12 MED ORDER — AMLODIPINE BESYLATE 5 MG PO TABS: 5.0000 mg | ORAL_TABLET | Freq: Every day | ORAL | 0 refills | Status: DC

## 2024-03-12 NOTE — Patient Instructions (Signed)
 It was great to see you!  Start amlodipine 1 tablet daily.   Keep exercising and limiting salt  Start checking your BP at least 3 days a week  Let's follow-up in 4 weeks, sooner if you have concerns.  If a referral was placed today, you will be contacted for an appointment. Please note that routine referrals can sometimes take up to 3-4 weeks to process. Please call our office if you haven't heard anything after this time frame.  Take care,  Rodman Pickle, NP

## 2024-03-12 NOTE — Assessment & Plan Note (Signed)
 Chronic, not controlled. BP has been elevated for the last 2 months and headaches may be related to this. Start amlodipine 5 mg daily. Check blood pressure at home at least three times a week. Monitor for symptoms of hypotension such as lightheadedness or dizziness. Schedule follow-up appointment in four weeks.

## 2024-03-12 NOTE — Progress Notes (Signed)
 Established Patient Office Visit  Subjective   Patient ID: Tara Morris, female    DOB: [birth year]  Age: 43 y.o. MRN: [id number]  Chief Complaint  Patient presents with   Hypertension    Concerns with elevated blood pressure, headaches   HPI:  Discussed the use of AI scribe software for clinical note transcription with the patient, who gave verbal consent to proceed.  History of Present Illness   The patient, with a history of hypertension, presents with concerns about fluctuating blood pressure readings and occasional headaches. The patient has been monitoring her blood pressure at home, with readings ranging from 119/87 to 142/94 over the past month. The patient reports no significant changes in lifestyle or stress levels that could account for these fluctuations. However, she does note a decrease in regular exercise and a possible increase in dietary salt and fatty foods due to recent work travel and eating out more frequently. The patient has not been taking any medication for blood pressure since a previous prescription for amlodipine. The patient also reports experiencing headaches, which she tries to manage without medication when possible. The patient denies any chest pain or shortness of breath.        ROS See pertinent positives and negatives per HPI.    Objective:     BP (!) 140/90 (BP Location: Left Arm, Cuff Size: Normal)   Pulse 73   Temp (!) 97.3 F (36.3 C)   Ht 5' 4.5" (1.638 m)   Wt 176 lb 6.4 oz (80 kg)   LMP 02/06/2024 (Approximate)   SpO2 99%   BMI 29.81 kg/m    Physical Exam Vitals and nursing note reviewed.  Constitutional:      General: She is not in acute distress.    Appearance: Normal appearance.  HENT:     Head: Normocephalic.  Eyes:     Conjunctiva/sclera: Conjunctivae normal.  Cardiovascular:     Rate and Rhythm: Normal rate and regular rhythm.     Pulses: Normal pulses.     Heart sounds: Normal heart sounds.  Pulmonary:      Effort: Pulmonary effort is normal.     Breath sounds: Normal breath sounds.  Musculoskeletal:     Cervical back: Normal range of motion.  Skin:    General: Skin is warm.  Neurological:     General: No focal deficit present.     Mental Status: She is alert and oriented to person, place, and time.  Psychiatric:        Mood and Affect: Mood normal.        Behavior: Behavior normal.        Thought Content: Thought content normal.        Judgment: Judgment normal.    The 10-year ASCVD risk score (Arnett DK, et al., 2019) is: 2.2%    Assessment & Plan:   Problem List Items Addressed This Visit       Cardiovascular and Mediastinum   Primary hypertension - Primary   Chronic, not controlled. BP has been elevated for the last 2 months and headaches may be related to this. Start amlodipine 5 mg daily. Check blood pressure at home at least three times a week. Monitor for symptoms of hypotension such as lightheadedness or dizziness. Schedule follow-up appointment in four weeks.       Relevant Medications   amLODipine (NORVASC) 5 MG tablet    Return in about 4 weeks (around 04/09/2024) for HTN.    Vanna Shavers A Shenelle Klas,  NP

## 2024-04-09 ENCOUNTER — Encounter: Payer: Self-pay | Admitting: Nurse Practitioner

## 2024-04-09 ENCOUNTER — Ambulatory Visit: Admitting: Nurse Practitioner

## 2024-04-09 VITALS — BP 122/88 | HR 69 | Temp 97.4°F | Ht 64.5 in | Wt 175.4 lb

## 2024-04-09 DIAGNOSIS — I10 Essential (primary) hypertension: Secondary | ICD-10-CM

## 2024-04-09 NOTE — Progress Notes (Signed)
   Established Patient Office Visit  Subjective   Patient ID: ZAHARAH BOUDREAUX, female    DOB: 26-Apr-1981  Age: 43 y.o. MRN: [id number]  Chief Complaint  Patient presents with   Hypertension    Follow up    HPI Discussed the use of AI scribe software for clinical note transcription with the patient, who gave verbal consent to proceed.  History of Present Illness   FREYAH HANUS is a 43 year old female with hypertension who presents for blood pressure management.  Her blood pressure has improved with amlodipine  without side effects. Occasionally, her blood pressure drops to 115/90, leading her to skip a dose without significant issues. She experiences no stress and sleeps well, though she occasionally naps due to work-related fatigue. She does not smoke, works from home, is mindful of her salt intake, and exercises regularly. Headaches have improved over the past few weeks. She denies chest pain and shortness of breath.        ROS See pertinent positives and negatives per HPI.    Objective:     BP 122/88 (BP Location: Left Arm, Patient Position: Sitting, Cuff Size: Normal)   Pulse 69   Temp (!) 97.4 F (36.3 C)   Ht 5' 4.5" (1.638 m)   Wt 175 lb 6.4 oz (79.6 kg)   LMP 04/09/2024 (Approximate)   SpO2 100%   BMI 29.64 kg/m  BP Readings from Last 3 Encounters:  04/09/24 122/88  03/12/24 (!) 140/90  08/15/23 134/84   Wt Readings from Last 3 Encounters:  04/09/24 175 lb 6.4 oz (79.6 kg)  03/12/24 176 lb 6.4 oz (80 kg)  08/15/23 174 lb 12.8 oz (79.3 kg)      Physical Exam Vitals and nursing note reviewed.  Constitutional:      General: She is not in acute distress.    Appearance: Normal appearance.  HENT:     Head: Normocephalic.  Eyes:     Conjunctiva/sclera: Conjunctivae normal.  Cardiovascular:     Rate and Rhythm: Normal rate and regular rhythm.     Pulses: Normal pulses.     Heart sounds: Normal heart sounds.  Pulmonary:     Effort: Pulmonary effort is  normal.     Breath sounds: Normal breath sounds.  Musculoskeletal:     Cervical back: Normal range of motion.  Skin:    General: Skin is warm.  Neurological:     General: No focal deficit present.     Mental Status: She is alert and oriented to person, place, and time.  Psychiatric:        Mood and Affect: Mood normal.        Behavior: Behavior normal.        Thought Content: Thought content normal.        Judgment: Judgment normal.    The 10-year ASCVD risk score (Arnett DK, et al., 2019) is: 1.1%    Assessment & Plan:   Problem List Items Addressed This Visit       Cardiovascular and Mediastinum   Primary hypertension - Primary   Chronic, stable. Continue amlodipine  5mg  daily as prescribed. Monitor blood pressure at home and report if systolic falls below 100 mmHg. Encourage lifestyle modifications, including reduced salt intake and regular exercise. Discuss potential medication discontinuation if blood pressure remains controlled. Follow-up in 4 months.         Return in about 4 months (around 08/10/2024) for CPE.    Odette Benjamin, NP

## 2024-04-09 NOTE — Patient Instructions (Signed)
 It was great to see you!  Keep taking the amlodipine  daily and checking your blood pressure at home  Let me know if the top number drops below 100  Let's follow-up in 4 months, sooner if you have concerns.  If a referral was placed today, you will be contacted for an appointment. Please note that routine referrals can sometimes take up to 3-4 weeks to process. Please call our office if you haven't heard anything after this time frame.  Take care,  Rheba Cedar, NP

## 2024-04-09 NOTE — Assessment & Plan Note (Signed)
 Chronic, stable. Continue amlodipine  5mg  daily as prescribed. Monitor blood pressure at home and report if systolic falls below 100 mmHg. Encourage lifestyle modifications, including reduced salt intake and regular exercise. Discuss potential medication discontinuation if blood pressure remains controlled. Follow-up in 4 months.

## 2024-06-13 ENCOUNTER — Other Ambulatory Visit: Payer: Self-pay | Admitting: Nurse Practitioner

## 2024-06-15 NOTE — Telephone Encounter (Signed)
 Requesting: AMLODIPINE  BESYLATE 5MG  TABLETS  Last Visit: 04/09/2024 Next Visit: 08/20/2024 Last Refill: 03/12/2024  Please Advise

## 2024-08-20 ENCOUNTER — Ambulatory Visit: Admitting: Nurse Practitioner

## 2024-09-09 ENCOUNTER — Ambulatory Visit (INDEPENDENT_AMBULATORY_CARE_PROVIDER_SITE_OTHER): Admitting: Nurse Practitioner

## 2024-09-09 ENCOUNTER — Encounter: Payer: Self-pay | Admitting: Nurse Practitioner

## 2024-09-09 VITALS — BP 116/78 | HR 74 | Temp 96.8°F | Ht 64.5 in | Wt 175.6 lb

## 2024-09-09 DIAGNOSIS — Z0000 Encounter for general adult medical examination without abnormal findings: Secondary | ICD-10-CM

## 2024-09-09 DIAGNOSIS — I10 Essential (primary) hypertension: Secondary | ICD-10-CM | POA: Diagnosis not present

## 2024-09-09 MED ORDER — AMLODIPINE BESYLATE 5 MG PO TABS: 5.0000 mg | ORAL_TABLET | Freq: Every day | ORAL | 3 refills | Status: AC

## 2024-09-09 NOTE — Progress Notes (Signed)
 BP 116/78 (BP Location: Left Arm, Patient Position: Sitting, Cuff Size: Normal)   Pulse 74   Temp (!) 96.8 F (36 C)   Ht 5' 4.5 (1.638 m)   Wt 175 lb 9.6 oz (79.7 kg)   LMP 09/08/2024 (Exact Date)   SpO2 98%   BMI 29.68 kg/m    Subjective:    Patient ID: Tara Morris, female    DOB: [birth year], 43 y.o.   MRN: [id number]  CC: Chief Complaint  Patient presents with   Annual Exam    No concerns and no labs    HPI: Tara Morris is a 43 y.o. female presenting on 09/09/2024 for comprehensive medical examination. Current medical complaints include:none  She currently lives with: alone Menopausal Symptoms: no  Depression and Anxiety Screen done today and results listed below:     09/09/2024    8:08 AM 03/12/2024    1:45 PM 08/15/2023    8:37 AM 06/10/2022    8:30 AM 02/11/2022    8:47 AM  Depression screen PHQ 2/9  Decreased Interest 0 0 0 0 0  Down, Depressed, Hopeless 0 0 0 0 0  PHQ - 2 Score 0 0 0 0 0  Altered sleeping 0  0  0  Tired, decreased energy 0  0  0  Change in appetite 0  0  0  Feeling bad or failure about yourself  0  0  0  Trouble concentrating 0  0  0  Moving slowly or fidgety/restless 0  0  0  Suicidal thoughts 0  0  0  PHQ-9 Score 0  0  0  Difficult doing work/chores Not difficult at all          09/09/2024    8:08 AM 08/15/2023    8:38 AM 06/10/2022    8:30 AM 02/11/2022    8:47 AM  GAD 7 : Generalized Anxiety Score  Nervous, Anxious, on Edge 0 0 0 0  Control/stop worrying 0 0 0 0  Worry too much - different things 0 0 0 0  Trouble relaxing 0 0 0 0  Restless 0 0 0 0  Easily annoyed or irritable 0 0 0 0  Afraid - awful might happen 0 0 0 0  Total GAD 7 Score 0 0 0 0  Anxiety Difficulty Not difficult at all  Not difficult at all     The patient does not have a history of falls. I did not complete a risk assessment for falls. A plan of care for falls was not documented.   Past Medical History:  Past Medical History:  Diagnosis Date    Hypertension     Surgical History:  Past Surgical History:  Procedure Laterality Date   BREAST REDUCTION SURGERY  2003    Medications:  Current Outpatient Medications on File Prior to Visit  Medication Sig   Multiple Vitamin (MULTIVITAMIN ADULT PO)    ondansetron  (ZOFRAN ) 4 MG tablet Take 1 tablet (4 mg total) by mouth every 8 (eight) hours as needed for nausea or vomiting. (Patient not taking: Reported on 09/09/2024)   No current facility-administered medications on file prior to visit.    Allergies:  No Known Allergies  Social History:  Social History   Socioeconomic History   Marital status: Single    Spouse name: Not on file   Number of children: Not on file   Years of education: Not on file   Highest education level: Not on file  Occupational History   Not on file  Tobacco Use   Smoking status: Never   Smokeless tobacco: Never  Vaping Use   Vaping status: Never Used  Substance and Sexual Activity   Alcohol use: Yes    Comment: socially   Drug use: Never   Sexual activity: Not on file  Other Topics Concern   Not on file  Social History Narrative   Not on file   Social Drivers of Health   Financial Resource Strain: Not on file  Food Insecurity: Not on file  Transportation Needs: Not on file  Physical Activity: Not on file  Stress: Not on file  Social Connections: Not on file  Intimate Partner Violence: Not on file   Social History   Tobacco Use  Smoking Status Never  Smokeless Tobacco Never   Social History   Substance and Sexual Activity  Alcohol Use Yes   Comment: socially    Family History:  Family History  Problem Relation Age of Onset   Hypertension Mother    Hyperlipidemia Maternal Grandmother    Hypertension Maternal Grandmother    Cancer Paternal Grandfather        prostate    Past medical history, surgical history, medications, allergies, family history and social history reviewed with patient today and changes made to  appropriate areas of the chart.   Review of Systems  Constitutional: Negative.   HENT: Negative.    Eyes: Negative.   Respiratory: Negative.    Cardiovascular: Negative.   Gastrointestinal: Negative.   Genitourinary: Negative.   Musculoskeletal: Negative.   Skin: Negative.   Neurological: Negative.   Psychiatric/Behavioral: Negative.     All other ROS negative except what is listed above and in the HPI.      Objective:    BP 116/78 (BP Location: Left Arm, Patient Position: Sitting, Cuff Size: Normal)   Pulse 74   Temp (!) 96.8 F (36 C)   Ht 5' 4.5 (1.638 m)   Wt 175 lb 9.6 oz (79.7 kg)   LMP 09/08/2024 (Exact Date)   SpO2 98%   BMI 29.68 kg/m   Wt Readings from Last 3 Encounters:  09/09/24 175 lb 9.6 oz (79.7 kg)  04/09/24 175 lb 6.4 oz (79.6 kg)  03/12/24 176 lb 6.4 oz (80 kg)    Physical Exam Vitals and nursing note reviewed.  Constitutional:      General: She is not in acute distress.    Appearance: Normal appearance.  HENT:     Head: Normocephalic and atraumatic.     Right Ear: Tympanic membrane, ear canal and external ear normal.     Left Ear: Tympanic membrane, ear canal and external ear normal.     Mouth/Throat:     Mouth: Mucous membranes are moist.     Pharynx: No posterior oropharyngeal erythema.  Eyes:     Conjunctiva/sclera: Conjunctivae normal.  Cardiovascular:     Rate and Rhythm: Normal rate and regular rhythm.     Pulses: Normal pulses.     Heart sounds: Normal heart sounds.  Pulmonary:     Effort: Pulmonary effort is normal.     Breath sounds: Normal breath sounds.  Abdominal:     Palpations: Abdomen is soft.     Tenderness: There is no abdominal tenderness.  Musculoskeletal:        General: Normal range of motion.     Cervical back: Normal range of motion and neck supple.     Right lower leg: No edema.  Left lower leg: No edema.  Lymphadenopathy:     Cervical: No cervical adenopathy.  Skin:    General: Skin is warm and dry.   Neurological:     General: No focal deficit present.     Mental Status: She is alert and oriented to person, place, and time.     Cranial Nerves: No cranial nerve deficit.     Coordination: Coordination normal.     Gait: Gait normal.  Psychiatric:        Mood and Affect: Mood normal.        Behavior: Behavior normal.        Thought Content: Thought content normal.        Judgment: Judgment normal.     Results for orders placed or performed in visit on 09/09/24  HM MAMMOGRAPHY   Collection Time: 09/08/23 12:00 AM  Result Value Ref Range   HM Mammogram 0-4 Bi-Rad 0-4 Bi-Rad, Self Reported Normal      Assessment & Plan:   Problem List Items Addressed This Visit       Cardiovascular and Mediastinum   Primary hypertension   Chronic, stable. Continue amlodipine  5 mg daily. Refill sent to the pharmacy. Recent labs reviewed. Follow-up 1 year      Relevant Medications   amLODipine  (NORVASC ) 5 MG tablet     Other   Routine general medical examination at a health care facility - Primary   Health maintenance reviewed and updated. Discussed nutrition, exercise. Follow-up 1 year.          Follow up plan: Return in about 1 year (around 09/09/2025) for CPE.   LABORATORY TESTING:  - Pap smear: up to date  IMMUNIZATIONS:   - Tdap: Tetanus vaccination status reviewed: last tetanus booster within 10 years. - Influenza: Declined - Pneumovax: Not applicable - Prevnar: Not applicable - HPV: Not applicable - Shingrix vaccine: Not applicable  SCREENING: -Mammogram: Up to date  - Colonoscopy: Not applicable  - Bone Density: Not applicable   PATIENT COUNSELING:   Advised to take 1 mg of folate supplement per day if capable of pregnancy.   Sexuality: Discussed sexually transmitted diseases, partner selection, use of condoms, avoidance of unintended pregnancy  and contraceptive alternatives.   Advised to avoid cigarette smoking.  I discussed with the patient that most people  either abstain from alcohol or drink within safe limits (<=14/week and <=4 drinks/occasion for males, <=7/weeks and <= 3 drinks/occasion for females) and that the risk for alcohol disorders and other health effects rises proportionally with the number of drinks per week and how often a drinker exceeds daily limits.  Discussed cessation/primary prevention of drug use and availability of treatment for abuse.   Diet: Encouraged to adjust caloric intake to maintain  or achieve ideal body weight, to reduce intake of dietary saturated fat and total fat, to limit sodium intake by avoiding high sodium foods and not adding table salt, and to maintain adequate dietary potassium and calcium preferably from fresh fruits, vegetables, and low-fat dairy products.    stressed the importance of regular exercise  Injury prevention: Discussed safety belts, safety helmets, smoke detector, smoking near bedding or upholstery.   Dental health: Discussed importance of regular tooth brushing, flossing, and dental visits.    NEXT PREVENTATIVE PHYSICAL DUE IN 1 YEAR. Return in about 1 year (around 09/09/2025) for CPE.  Eilene Voigt A Maysen Bonsignore

## 2024-09-09 NOTE — Assessment & Plan Note (Signed)
 Chronic, stable. Continue amlodipine  5 mg daily. Refill sent to the pharmacy. Recent labs reviewed. Follow-up 1 year

## 2024-09-09 NOTE — Assessment & Plan Note (Addendum)
 Health maintenance reviewed and updated. Discussed nutrition, exercise. Follow-up 1 year.

## 2024-09-09 NOTE — Patient Instructions (Signed)
 It was great to see you!  Keep up the great work!   I have refilled the amlodipine    Let's follow-up in 1 year, sooner if you have concerns.  If a referral was placed today, you will be contacted for an appointment. Please note that routine referrals can sometimes take up to 3-4 weeks to process. Please call our office if you haven't heard anything after this time frame.  Take care,  Tinnie Harada, NP

## 2024-09-18 ENCOUNTER — Other Ambulatory Visit: Payer: Self-pay | Admitting: Nurse Practitioner

## 2024-09-21 NOTE — Telephone Encounter (Signed)
 Requesting: AMLODIPINE  BESYLATE 5MG  TABLETS  Last Visit: 09/09/2024 Next Visit: Visit date not found Last Refill: 09/09/2024  Please Advise

## 2025-09-15 ENCOUNTER — Encounter: Admitting: Nurse Practitioner
